# Patient Record
Sex: Female | Born: 1945 | Race: White | Hispanic: No | Smoking: Never smoker
Health system: Southern US, Community
[De-identification: ages and names within clinical notes are randomized; demographics above are authoritative.]

## PROBLEM LIST (undated history)

## (undated) DIAGNOSIS — E039 Hypothyroidism, unspecified: Secondary | ICD-10-CM

## (undated) DIAGNOSIS — R252 Cramp and spasm: Secondary | ICD-10-CM

## (undated) DIAGNOSIS — G5602 Carpal tunnel syndrome, left upper limb: Secondary | ICD-10-CM

## (undated) DIAGNOSIS — R200 Anesthesia of skin: Secondary | ICD-10-CM

## (undated) DIAGNOSIS — K5730 Diverticulosis of large intestine without perforation or abscess without bleeding: Secondary | ICD-10-CM

## (undated) DIAGNOSIS — M1990 Unspecified osteoarthritis, unspecified site: Secondary | ICD-10-CM

## (undated) DIAGNOSIS — E538 Deficiency of other specified B group vitamins: Secondary | ICD-10-CM

## (undated) DIAGNOSIS — R7303 Prediabetes: Secondary | ICD-10-CM

## (undated) DIAGNOSIS — G9332 Myalgic encephalomyelitis/chronic fatigue syndrome: Secondary | ICD-10-CM

## (undated) DIAGNOSIS — K56609 Unspecified intestinal obstruction, unspecified as to partial versus complete obstruction: Secondary | ICD-10-CM

## (undated) DIAGNOSIS — R202 Paresthesia of skin: Secondary | ICD-10-CM

## (undated) DIAGNOSIS — T7840XA Allergy, unspecified, initial encounter: Secondary | ICD-10-CM

## (undated) DIAGNOSIS — R51 Headache: Secondary | ICD-10-CM

## (undated) DIAGNOSIS — E669 Obesity, unspecified: Secondary | ICD-10-CM

## (undated) DIAGNOSIS — H43392 Other vitreous opacities, left eye: Secondary | ICD-10-CM

## (undated) DIAGNOSIS — K219 Gastro-esophageal reflux disease without esophagitis: Secondary | ICD-10-CM

## (undated) DIAGNOSIS — F419 Anxiety disorder, unspecified: Secondary | ICD-10-CM

## (undated) DIAGNOSIS — E785 Hyperlipidemia, unspecified: Secondary | ICD-10-CM

## (undated) DIAGNOSIS — D649 Anemia, unspecified: Secondary | ICD-10-CM

## (undated) DIAGNOSIS — M797 Fibromyalgia: Secondary | ICD-10-CM

## (undated) DIAGNOSIS — R509 Fever, unspecified: Secondary | ICD-10-CM

## (undated) DIAGNOSIS — R42 Dizziness and giddiness: Secondary | ICD-10-CM

## (undated) DIAGNOSIS — D869 Sarcoidosis, unspecified: Secondary | ICD-10-CM

## (undated) DIAGNOSIS — K449 Diaphragmatic hernia without obstruction or gangrene: Secondary | ICD-10-CM

## (undated) DIAGNOSIS — IMO0002 Reserved for concepts with insufficient information to code with codable children: Secondary | ICD-10-CM

## (undated) DIAGNOSIS — E079 Disorder of thyroid, unspecified: Secondary | ICD-10-CM

## (undated) DIAGNOSIS — R5382 Chronic fatigue, unspecified: Secondary | ICD-10-CM

## (undated) DIAGNOSIS — K589 Irritable bowel syndrome without diarrhea: Secondary | ICD-10-CM

## (undated) DIAGNOSIS — M549 Dorsalgia, unspecified: Secondary | ICD-10-CM

## (undated) DIAGNOSIS — R351 Nocturia: Secondary | ICD-10-CM

## (undated) DIAGNOSIS — B079 Viral wart, unspecified: Secondary | ICD-10-CM

## (undated) DIAGNOSIS — N390 Urinary tract infection, site not specified: Secondary | ICD-10-CM

## (undated) DIAGNOSIS — K660 Peritoneal adhesions (postprocedural) (postinfection): Secondary | ICD-10-CM

## (undated) DIAGNOSIS — J45909 Unspecified asthma, uncomplicated: Secondary | ICD-10-CM

## (undated) DIAGNOSIS — Z8719 Personal history of other diseases of the digestive system: Secondary | ICD-10-CM

## (undated) DIAGNOSIS — K649 Unspecified hemorrhoids: Secondary | ICD-10-CM

## (undated) DIAGNOSIS — M659 Synovitis and tenosynovitis, unspecified: Secondary | ICD-10-CM

## (undated) DIAGNOSIS — K279 Peptic ulcer, site unspecified, unspecified as acute or chronic, without hemorrhage or perforation: Secondary | ICD-10-CM

## (undated) DIAGNOSIS — R1084 Generalized abdominal pain: Secondary | ICD-10-CM

## (undated) DIAGNOSIS — M6580 Other synovitis and tenosynovitis, unspecified site: Secondary | ICD-10-CM

## (undated) DIAGNOSIS — R0602 Shortness of breath: Secondary | ICD-10-CM

## (undated) DIAGNOSIS — I951 Orthostatic hypotension: Secondary | ICD-10-CM

## (undated) HISTORY — DX: Headache: R51

## (undated) HISTORY — PX: ESOPHAGOGASTRODUODENOSCOPY: SHX1529

## (undated) HISTORY — PX: CARPAL TUNNEL RELEASE: SHX101

## (undated) HISTORY — PX: LUNG BIOPSY: SHX232

## (undated) HISTORY — DX: Peritoneal adhesions (postprocedural) (postinfection): K66.0

## (undated) HISTORY — DX: Sarcoidosis, unspecified: D86.9

## (undated) HISTORY — DX: Generalized abdominal pain: R10.84

## (undated) HISTORY — DX: Diverticulosis of large intestine without perforation or abscess without bleeding: K57.30

## (undated) HISTORY — DX: Dorsalgia, unspecified: M54.9

## (undated) HISTORY — PX: TONSILLECTOMY: SUR1361

## (undated) HISTORY — DX: Chronic fatigue, unspecified: R53.82

## (undated) HISTORY — DX: Viral wart, unspecified: B07.9

## (undated) HISTORY — DX: Cramp and spasm: R25.2

## (undated) HISTORY — PX: BELPHAROPTOSIS REPAIR: SHX369

## (undated) HISTORY — DX: Peptic ulcer, site unspecified, unspecified as acute or chronic, without hemorrhage or perforation: K27.9

## (undated) HISTORY — DX: Hyperlipidemia, unspecified: E78.5

## (undated) HISTORY — DX: Deficiency of other specified B group vitamins: E53.8

## (undated) HISTORY — DX: Allergy, unspecified, initial encounter: T78.40XA

## (undated) HISTORY — DX: Unspecified osteoarthritis, unspecified site: M19.90

## (undated) HISTORY — DX: Disorder of thyroid, unspecified: E07.9

## (undated) HISTORY — DX: Unspecified intestinal obstruction, unspecified as to partial versus complete obstruction: K56.609

## (undated) HISTORY — PX: DILATION AND CURETTAGE OF UTERUS: SHX78

## (undated) HISTORY — DX: Obesity, unspecified: E66.9

## (undated) HISTORY — DX: Reserved for concepts with insufficient information to code with codable children: IMO0002

## (undated) HISTORY — DX: Orthostatic hypotension: I95.1

## (undated) HISTORY — PX: LYMPH NODE DISSECTION: SHX5087

## (undated) HISTORY — PX: COLONOSCOPY W/ POLYPECTOMY: SHX1380

## (undated) HISTORY — DX: Gastro-esophageal reflux disease without esophagitis: K21.9

## (undated) HISTORY — PX: BIOPSY THYROID: PRO38

## (undated) HISTORY — PX: APPENDECTOMY: SHX54

## (undated) HISTORY — DX: Myalgic encephalomyelitis/chronic fatigue syndrome: G93.32

## (undated) HISTORY — DX: Fever, unspecified: R50.9

---

## 1989-01-05 HISTORY — PX: OTHER SURGICAL HISTORY: SHX169

## 1994-01-05 HISTORY — PX: COLON SURGERY: SHX602

## 1994-01-05 HISTORY — PX: PARTIAL COLECTOMY: SHX5273

## 1996-01-06 HISTORY — PX: LASIK: SHX215

## 1996-01-06 HISTORY — PX: CATARACT EXTRACTION: SUR2

## 1999-09-15 ENCOUNTER — Encounter: Payer: Self-pay | Admitting: Family Medicine

## 1999-09-15 ENCOUNTER — Ambulatory Visit (HOSPITAL_COMMUNITY): Admission: RE | Admit: 1999-09-15 | Discharge: 1999-09-15 | Payer: Self-pay | Admitting: Family Medicine

## 2000-01-06 HISTORY — PX: OTHER SURGICAL HISTORY: SHX169

## 2000-02-17 ENCOUNTER — Ambulatory Visit (HOSPITAL_COMMUNITY): Admission: RE | Admit: 2000-02-17 | Discharge: 2000-02-17 | Payer: Self-pay | Admitting: Family Medicine

## 2000-02-17 ENCOUNTER — Encounter: Payer: Self-pay | Admitting: Family Medicine

## 2001-03-07 ENCOUNTER — Ambulatory Visit (HOSPITAL_COMMUNITY): Admission: RE | Admit: 2001-03-07 | Discharge: 2001-03-07 | Payer: Self-pay | Admitting: Family Medicine

## 2001-03-07 ENCOUNTER — Encounter: Payer: Self-pay | Admitting: Family Medicine

## 2002-02-10 ENCOUNTER — Encounter: Payer: Self-pay | Admitting: Gastroenterology

## 2002-02-10 ENCOUNTER — Ambulatory Visit (HOSPITAL_COMMUNITY): Admission: RE | Admit: 2002-02-10 | Discharge: 2002-02-10 | Payer: Self-pay | Admitting: Gastroenterology

## 2002-06-14 ENCOUNTER — Emergency Department (HOSPITAL_COMMUNITY): Admission: EM | Admit: 2002-06-14 | Discharge: 2002-06-14 | Payer: Self-pay | Admitting: Emergency Medicine

## 2002-06-14 ENCOUNTER — Encounter: Payer: Self-pay | Admitting: Internal Medicine

## 2002-07-04 ENCOUNTER — Encounter: Payer: Self-pay | Admitting: Gastroenterology

## 2002-07-04 ENCOUNTER — Ambulatory Visit (HOSPITAL_COMMUNITY): Admission: RE | Admit: 2002-07-04 | Discharge: 2002-07-04 | Payer: Self-pay | Admitting: Gastroenterology

## 2003-01-06 HISTORY — PX: HERNIA REPAIR: SHX51

## 2003-01-06 LAB — HM MAMMOGRAPHY

## 2003-06-05 ENCOUNTER — Other Ambulatory Visit: Admission: RE | Admit: 2003-06-05 | Discharge: 2003-06-05 | Payer: Self-pay | Admitting: Diagnostic Radiology

## 2003-08-08 ENCOUNTER — Encounter: Admission: RE | Admit: 2003-08-08 | Discharge: 2003-08-08 | Payer: Self-pay | Admitting: General Surgery

## 2003-09-19 ENCOUNTER — Ambulatory Visit (HOSPITAL_COMMUNITY): Admission: RE | Admit: 2003-09-19 | Discharge: 2003-09-19 | Payer: Self-pay | Admitting: Gastroenterology

## 2003-10-09 ENCOUNTER — Inpatient Hospital Stay (HOSPITAL_COMMUNITY): Admission: RE | Admit: 2003-10-09 | Discharge: 2003-10-18 | Payer: Self-pay | Admitting: General Surgery

## 2004-01-06 HISTORY — PX: NOSE SURGERY: SHX723

## 2004-01-06 HISTORY — PX: CHOLECYSTECTOMY: SHX55

## 2004-01-06 HISTORY — PX: NASAL SEPTUM SURGERY: SHX37

## 2004-01-25 ENCOUNTER — Ambulatory Visit: Payer: Self-pay | Admitting: Gastroenterology

## 2005-01-01 ENCOUNTER — Encounter: Payer: Self-pay | Admitting: Family Medicine

## 2005-05-08 ENCOUNTER — Ambulatory Visit (HOSPITAL_COMMUNITY): Admission: RE | Admit: 2005-05-08 | Discharge: 2005-05-08 | Payer: Self-pay | Admitting: Family Medicine

## 2005-08-05 HISTORY — PX: OTHER SURGICAL HISTORY: SHX169

## 2006-10-13 ENCOUNTER — Encounter: Payer: Self-pay | Admitting: Family Medicine

## 2006-10-13 ENCOUNTER — Ambulatory Visit: Payer: Self-pay | Admitting: Infectious Disease

## 2006-10-13 DIAGNOSIS — K660 Peritoneal adhesions (postprocedural) (postinfection): Secondary | ICD-10-CM | POA: Insufficient documentation

## 2006-10-13 DIAGNOSIS — G9009 Other idiopathic peripheral autonomic neuropathy: Secondary | ICD-10-CM | POA: Insufficient documentation

## 2006-10-13 DIAGNOSIS — R5382 Chronic fatigue, unspecified: Secondary | ICD-10-CM | POA: Insufficient documentation

## 2006-10-13 DIAGNOSIS — R51 Headache: Secondary | ICD-10-CM | POA: Insufficient documentation

## 2006-10-13 DIAGNOSIS — K5730 Diverticulosis of large intestine without perforation or abscess without bleeding: Secondary | ICD-10-CM | POA: Insufficient documentation

## 2006-10-13 DIAGNOSIS — K56609 Unspecified intestinal obstruction, unspecified as to partial versus complete obstruction: Secondary | ICD-10-CM | POA: Insufficient documentation

## 2006-10-13 DIAGNOSIS — D869 Sarcoidosis, unspecified: Secondary | ICD-10-CM | POA: Insufficient documentation

## 2006-10-13 DIAGNOSIS — K279 Peptic ulcer, site unspecified, unspecified as acute or chronic, without hemorrhage or perforation: Secondary | ICD-10-CM | POA: Insufficient documentation

## 2006-10-13 DIAGNOSIS — R5081 Fever presenting with conditions classified elsewhere: Secondary | ICD-10-CM | POA: Insufficient documentation

## 2006-10-13 DIAGNOSIS — R519 Headache, unspecified: Secondary | ICD-10-CM | POA: Insufficient documentation

## 2006-10-13 LAB — CONVERTED CEMR LAB
ALT: 21 units/L (ref 0–35)
Albumin: 4.3 g/dL (ref 3.5–5.2)
Alkaline Phosphatase: 92 units/L (ref 39–117)
BUN: 18 mg/dL (ref 6–23)
Basophils Relative: 0 % (ref 0–1)
Calcium: 9.2 mg/dL
Chloride: 107 meq/L (ref 96–112)
Creatinine, Ser: 0.93 mg/dL (ref 0.40–1.20)
Eosinophils Absolute: 0.1 10*3/uL (ref 0.0–0.7)
Glucose, Bld: 97 mg/dL (ref 70–99)
HCT: 43.2 %
HCT: 43.2 % (ref 36.0–46.0)
HCV Ab: NEGATIVE
Hemoglobin: 14.2 g/dL
Lymphs Abs: 1.4 10*3/uL (ref 0.7–3.3)
MCV: 93.3 fL (ref 78.0–100.0)
Neutro Abs: 2.6 10*3/uL (ref 1.7–7.7)
Neutrophils Relative %: 57 % (ref 43–77)
Platelets: 215 10*3/uL (ref 150–400)
Potassium: 3.9 meq/L (ref 3.5–5.3)
Total CK: 78 units/L (ref 7–177)
Uric Acid, Serum: 6.5 mg/dL

## 2006-10-15 ENCOUNTER — Encounter: Payer: Self-pay | Admitting: Infectious Disease

## 2006-12-13 ENCOUNTER — Encounter: Payer: Self-pay | Admitting: Internal Medicine

## 2006-12-13 ENCOUNTER — Ambulatory Visit: Payer: Self-pay | Admitting: Infectious Disease

## 2006-12-13 DIAGNOSIS — R1084 Generalized abdominal pain: Secondary | ICD-10-CM | POA: Insufficient documentation

## 2006-12-13 DIAGNOSIS — B009 Herpesviral infection, unspecified: Secondary | ICD-10-CM | POA: Insufficient documentation

## 2007-01-05 ENCOUNTER — Telehealth: Payer: Self-pay | Admitting: Infectious Disease

## 2007-11-01 ENCOUNTER — Encounter: Payer: Self-pay | Admitting: Infectious Disease

## 2007-11-10 ENCOUNTER — Encounter (INDEPENDENT_AMBULATORY_CARE_PROVIDER_SITE_OTHER): Payer: Self-pay | Admitting: Internal Medicine

## 2008-01-06 HISTORY — PX: DOPPLER ECHOCARDIOGRAPHY: SHX263

## 2008-05-13 HISTORY — PX: OTHER SURGICAL HISTORY: SHX169

## 2008-10-23 ENCOUNTER — Encounter: Payer: Self-pay | Admitting: Family Medicine

## 2009-02-28 ENCOUNTER — Encounter: Payer: Self-pay | Admitting: Family Medicine

## 2009-02-28 LAB — CONVERTED CEMR LAB
BUN: 10 mg/dL
Glucose, Bld: 107 mg/dL
Potassium: 4.1 meq/L
Total Protein: 6.1 g/dL

## 2009-08-15 ENCOUNTER — Ambulatory Visit: Payer: Self-pay | Admitting: Family Medicine

## 2009-08-15 DIAGNOSIS — B079 Viral wart, unspecified: Secondary | ICD-10-CM | POA: Insufficient documentation

## 2009-08-15 DIAGNOSIS — M549 Dorsalgia, unspecified: Secondary | ICD-10-CM | POA: Insufficient documentation

## 2009-08-15 DIAGNOSIS — R252 Cramp and spasm: Secondary | ICD-10-CM | POA: Insufficient documentation

## 2009-08-15 DIAGNOSIS — E039 Hypothyroidism, unspecified: Secondary | ICD-10-CM | POA: Insufficient documentation

## 2009-08-16 ENCOUNTER — Encounter (INDEPENDENT_AMBULATORY_CARE_PROVIDER_SITE_OTHER): Payer: Self-pay | Admitting: *Deleted

## 2009-08-16 LAB — CONVERTED CEMR LAB
BUN: 17 mg/dL (ref 6–23)
CO2: 29 meq/L (ref 19–32)
Calcium: 9.7 mg/dL (ref 8.4–10.5)
Chloride: 106 meq/L (ref 96–112)
Creatinine, Ser: 0.7 mg/dL (ref 0.4–1.2)
GFR calc non Af Amer: 96.45 mL/min (ref >60)
Potassium: 4.2 meq/L (ref 3.5–5.1)
Sodium: 144 meq/L (ref 135–145)

## 2009-09-04 ENCOUNTER — Encounter: Payer: Self-pay | Admitting: Family Medicine

## 2009-09-04 DIAGNOSIS — J309 Allergic rhinitis, unspecified: Secondary | ICD-10-CM | POA: Insufficient documentation

## 2009-09-04 DIAGNOSIS — K219 Gastro-esophageal reflux disease without esophagitis: Secondary | ICD-10-CM | POA: Insufficient documentation

## 2009-09-04 DIAGNOSIS — E669 Obesity, unspecified: Secondary | ICD-10-CM | POA: Insufficient documentation

## 2009-09-04 DIAGNOSIS — E785 Hyperlipidemia, unspecified: Secondary | ICD-10-CM | POA: Insufficient documentation

## 2009-09-04 DIAGNOSIS — M1990 Unspecified osteoarthritis, unspecified site: Secondary | ICD-10-CM | POA: Insufficient documentation

## 2009-09-04 DIAGNOSIS — IMO0002 Reserved for concepts with insufficient information to code with codable children: Secondary | ICD-10-CM | POA: Insufficient documentation

## 2009-09-04 DIAGNOSIS — E538 Deficiency of other specified B group vitamins: Secondary | ICD-10-CM | POA: Insufficient documentation

## 2009-09-13 ENCOUNTER — Encounter: Payer: Self-pay | Admitting: Family Medicine

## 2009-10-11 ENCOUNTER — Telehealth: Payer: Self-pay | Admitting: Family Medicine

## 2009-10-14 ENCOUNTER — Encounter: Payer: Self-pay | Admitting: Family Medicine

## 2009-10-15 ENCOUNTER — Encounter: Payer: Self-pay | Admitting: Family Medicine

## 2009-12-02 ENCOUNTER — Telehealth: Payer: Self-pay | Admitting: Family Medicine

## 2009-12-23 ENCOUNTER — Telehealth: Payer: Self-pay | Admitting: Family Medicine

## 2010-01-26 ENCOUNTER — Encounter: Payer: Self-pay | Admitting: General Surgery

## 2010-01-26 ENCOUNTER — Encounter: Payer: Self-pay | Admitting: Infectious Disease

## 2010-02-04 NOTE — Miscellaneous (Signed)
Summary: TSH level  Clinical Lists Changes  Observations: Added new observation of TSH: 2.090 microintl units/mL (10/14/2009 16:31)

## 2010-02-04 NOTE — Progress Notes (Signed)
Summary: Armour Thyroid  Phone Note Refill Request Message from:  Fax from Pharmacy on December 02, 2009 10:32 AM  Refills Requested: Medication #1:  ARMOUR THYROID 60 MG  TABS Take 1 tablet by mouth once daily [facility]  Phone:   [phone]    The refill request directions say Take 2 tablets by mouth every morning.  Our meds list states 1 tablet by mouth every morning.  Please advise.   Method Requested: Electronic Initial call taken by: Delilah Shan CMA Duncan Dull),  December 02, 2009 10:33 AM  Follow-up for Phone Call        Needs to be 1 by mouth once daily. please call and clarify with them.  thanks  Follow-up by: Crawford Givens MD,  December 02, 2009 11:02 AM  Additional Follow-up for Phone Call Additional follow up Details #1::        Medication phoned to pharmacy.  Additional Follow-up by: Delilah Shan CMA (AAMA),  December 02, 2009 11:22 AM    Prescriptions: ARMOUR THYROID 60 MG  TABS (THYROID) Take 1 tablet by mouth once daily  #90 x 3   Entered and Authorized by:   Crawford Givens MD   Signed by:   Crawford Givens MD on 12/02/2009   Method used:   Telephoned to ...       [facility] (retail)       [address]       [place], [place]  [address]       Ph: [phone]       Fax: [phone]   RxID:   [id number]

## 2010-02-04 NOTE — Letter (Signed)
Summary: Generic Letter   at [facility]  [address] [place], [place] [address]   Phone: [phone]  Fax: [phone]    08/16/2009    Angela Dudley [address] [place], [place]  [address] DOB:  03/26/1945        Please draw at [facility] [phone] on the above-mentioned patient.   Dwana Curd Para March, M.D.  GSD:lsf           Sincerely,   Delilah Shan CMA (AAMA)

## 2010-02-04 NOTE — Op Note (Signed)
Summary: [facility]  [facility]   Imported By: Lanelle Bal 09/16/2009 08:38:46  _____________________________________________________________________  External Attachment:    Type:   Image     Comment:   External Document

## 2010-02-04 NOTE — Letter (Signed)
Summary: [facility] [employer] [facility] [place]   Imported By: Sherian Rein 09/18/2009 09:41:58  _____________________________________________________________________  External Attachment:    Type:   Image     Comment:   External Document  Appended Document: [facility]    Clinical Lists Changes  Observations: Added new observation of PAST MED HX: GERD (ICD-530.81) VITAMIN B12 DEFICIENCY (ICD-266.2) ALLERGIC RHINITIS (ICD-477.9) HYPERLIPIDEMIA (ICD-272.4) OBESITY (ICD-278.00) OSTEOARTHRITIS (ICD-715.90) DEGENERATIVE DISC DISEASE (ICD-722.6) BACK PAIN (ICD-724.5) UNSPECIFIED HYPOTHYROIDISM (ICD-244.9) WART, VIRAL (ICD-078.10) MUSCLE CRAMPS (ICD-729.82) HERPES SIMPLEX WITHOUT MENTION OF COMPLICATION (ICD-054.9) FATIGUE, CHRONIC (ICD-780.79) ABDOMINAL PAIN, GENERALIZED (ICD-789.07) ULCER, PEPTIC NOS W/O HEM/PERF W/O OBST (ICD-533.90) SMALL BOWEL OBSTRUCTION (ICD-560.9) CHRONIC FATIGUE SYNDROME (ICD-780.71) ADHESIONS, 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  Vertebrals demonstrate antegrade flow bilaterally. (05/13/2008) Chest Pain with normal nuclear stress test 08/2005 Diastolic relaxation abnormality, mild AVSC, mild MR/TR and mild to mod AR by echo 2010 Followed by Dr. Ferol Luz with ENT Allergy eval 2011 Dr. Larina Bras (09/18/2009 11:45)         Past History:  Past Medical History: GERD (ICD-530.81) VITAMIN B12 DEFICIENCY (ICD-266.2) ALLERGIC RHINITIS (ICD-477.9) HYPERLIPIDEMIA (ICD-272.4) OBESITY (ICD-278.00) OSTEOARTHRITIS (ICD-715.90) DEGENERATIVE DISC DISEASE (ICD-722.6) BACK PAIN (ICD-724.5) UNSPECIFIED HYPOTHYROIDISM  (ICD-244.9) WART, VIRAL (ICD-078.10) MUSCLE CRAMPS (ICD-729.82) HERPES SIMPLEX WITHOUT MENTION OF COMPLICATION (ICD-054.9) FATIGUE, CHRONIC (ICD-780.79) ABDOMINAL PAIN, GENERALIZED (ICD-789.07) ULCER, PEPTIC NOS W/O HEM/PERF W/O OBST (ICD-533.90) SMALL BOWEL OBSTRUCTION (ICD-560.9) CHRONIC FATIGUE SYNDROME (ICD-780.71) ADHESIONS, 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  Vertebrals demonstrate antegrade flow bilaterally. (05/13/2008) Chest Pain with normal nuclear stress test 08/2005 Diastolic relaxation abnormality, mild AVSC, mild MR/TR and mild to mod AR by echo 2010 Followed by Dr. Ferol Luz with ENT Allergy eval 2011 Dr. Larina Bras
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
## 2010-02-04 NOTE — Miscellaneous (Signed)
Clinical Lists Changes  Problems: Added new problem of DEGENERATIVE DISC DISEASE (ICD-722.6) Added new problem of OSTEOARTHRITIS (ICD-715.90) Added new problem of OBESITY (ICD-278.00) Added new problem of HYPERLIPIDEMIA (ICD-272.4) Added new problem of ALLERGIC RHINITIS (ICD-477.9) Added new problem of VITAMIN B12 DEFICIENCY (ICD-266.2) Added new problem of GERD (ICD-530.81) Observations: Added new observation of PAST SURG HX: R hip bursa removal 1991 Colectomy, partial 1996, complicated by an abscess formation LASIK, cataract 1998 SBO 2002 Hernia repair 2005 and 2006 (ventral) Nasal surgery 2006 Appendectomy Cholecystectomy Tonsillectomy Hysterectomy 1954 Right Hip Bursectomy 1961 Sarcoidosis 1994  (09/04/2009 14:56) Added new observation of PAST MED HX: GERD (ICD-530.81) VITAMIN B12 DEFICIENCY (ICD-266.2) ALLERGIC RHINITIS (ICD-477.9) HYPERLIPIDEMIA (ICD-272.4) OBESITY (ICD-278.00) OSTEOARTHRITIS (ICD-715.90) DEGENERATIVE DISC DISEASE (ICD-722.6) BACK PAIN (ICD-724.5) UNSPECIFIED HYPOTHYROIDISM (ICD-244.9) WART, VIRAL (ICD-078.10) MUSCLE CRAMPS (ICD-729.82) HERPES SIMPLEX WITHOUT MENTION OF COMPLICATION (ICD-054.9) FATIGUE, CHRONIC (ICD-780.79) ABDOMINAL PAIN, GENERALIZED (ICD-789.07) ULCER, PEPTIC NOS W/O HEM/PERF W/O OBST (ICD-533.90) SMALL BOWEL OBSTRUCTION (ICD-560.9) CHRONIC FATIGUE SYNDROME (ICD-780.71) ADHESIONS, 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  Vertebrals demonstrate antegrade flow bilaterally. (05/13/2008) Chest Pain with normal nuclear stress test 08/2005 Diastolic relaxation abnormality, mild AVSC, mild MR/TR and mild to mod AR by echo 2010 (09/04/2009  14:56) Added new observation of HGB: 13.9 g/dL (30/86/5784 69:62) Added new observation of CALCIUM: 9.4 mg/dL (95/28/4132 44:01) Added new observation of PROTEIN, TOT: 6.1 g/dL (02/72/5366 44:03) Added new observation of SGPT (ALT): 40 units/L (02/28/2009 14:56) Added new observation of SGOT (AST): 34 units/L (02/28/2009 14:56) Added new observation of CREATININE: 0.90 mg/dL (47/42/5956 38:75) Added new observation of BUN: 10 mg/dL (64/33/2951 88:41) Added new observation of BG RANDOM: 107 mg/dL (66/06/3014 01:09) Added new observation of K SERUM: 4.1 meq/L (02/28/2009 14:56) Added new observation of NA: 144 meq/L (02/28/2009 14:56) Added new observation of HCT: 43.2 % (10/13/2006 14:56) Added new observation of HGB: 14.2 g/dL (32/35/5732 20:25) Added new observation of URIC ACID: 6.5 mg/dL (42/70/6237 62:83) Added new observation of CALCIUM: 9.2 mg/dL (15/17/6160 73:71) Added new observation of PROTEIN, TOT: 6.6 g/dL (07/01/9483 46:27) Added new observation of SGPT (ALT): 21 units/L (10/13/2006 14:56) Added new observation of SGOT (AST): 21 units/L (10/13/2006 14:56) Added new observation of CREATININE: 0.93 mg/dL (03/50/0938 18:29) Added new observation of BUN: 18 mg/dL (93/71/6967 89:38) Added new observation of BG RANDOM: 97 mg/dL (10/21/5100 58:52) Added new observation of K SERUM: 3.9 meq/L (10/13/2006 14:56) Added new observation of NA: 146 meq/L (10/13/2006 14:56) Added new observation of COLONOSCOPY: Done  (08/05/2004 15:11) Added new observation of MAMMOGRAM: Done  (01/06/2003 15:11) Added new observation of TD BOOSTER: Td  (01/05/2001 15:11) Added new observation of BONE DENSITY: Done std dev (01/05/1997 15:11) Added new observation of PNEUMOVAX: Pneumovax  (01/06/1996 15:12)      Past History:  Past Medical History: GERD (ICD-530.81) VITAMIN B12 DEFICIENCY (ICD-266.2) ALLERGIC RHINITIS (ICD-477.9) HYPERLIPIDEMIA (ICD-272.4) OBESITY (ICD-278.00) OSTEOARTHRITIS  (ICD-715.90) DEGENERATIVE DISC DISEASE (ICD-722.6) BACK PAIN (ICD-724.5) UNSPECIFIED HYPOTHYROIDISM (ICD-244.9) WART, VIRAL (ICD-078.10) MUSCLE CRAMPS (ICD-729.82) HERPES SIMPLEX WITHOUT MENTION OF COMPLICATION (ICD-054.9) FATIGUE, CHRONIC (ICD-780.79) ABDOMINAL PAIN, GENERALIZED (ICD-789.07) ULCER, PEPTIC NOS W/O HEM/PERF W/O OBST (ICD-533.90) SMALL BOWEL OBSTRUCTION (ICD-560.9) CHRONIC FATIGUE SYNDROME (ICD-780.71) ADHESIONS, 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  Vertebrals demonstrate antegrade flow bilaterally. (05/13/2008) Chest Pain with normal nuclear stress test 08/2005 Diastolic relaxation abnormality, mild AVSC, mild MR/TR and mild to mod AR by echo 2010  Past Surgical History: R hip bursa removal 1991 Colectomy, partial 1996, complicated by an abscess formation LASIK, cataract 1998 SBO 2002 Hernia repair 2005 and 2006 (ventral) Nasal surgery 2006 Appendectomy Cholecystectomy Tonsillectomy Hysterectomy 1954 Right Hip Bursectomy 1961 Sarcoidosis 1994     Preventive Care Screening  Colonoscopy:    Date:  08/05/2004    Results:  Done  Mammogram:    Date:  01/06/2003    Results:  Done  Last Tetanus Booster:    Date:  01/05/2001    Results:  Td  Bone Density:    Date:  01/05/1997    Results:  Done     Cardiolite 1999 Declines further mammography.    Immunization History:  Pneumovax Immunization History:    Pneumovax:  pneumovax (01/06/1996)

## 2010-02-04 NOTE — Progress Notes (Signed)
Summary: gabapentin  Phone Note Refill Request Message from:  Fax from Pharmacy on October 11, 2009 10:19 AM  Refills Requested: Medication #1:  GABAPENTIN 100 MG CAPS Take 3 capsules by mouth at bedtime   Last Refilled: 08/10/2009 Refill request from [facility]. [phone].   Initial call taken by: Melody Comas,  October 11, 2009 10:19 AM  Follow-up for Phone Call       Follow-up by: Crawford Givens MD,  October 13, 2009 6:46 PM    Prescriptions: GABAPENTIN 100 MG CAPS (GABAPENTIN) Take 3 capsules by mouth at bedtime  #270 x 3   Entered and Authorized by:   Crawford Givens MD   Signed by:   Crawford Givens MD on 10/13/2009   Method used:   Faxed to ...       [facility] (retail)       [address]       [place], [place]  [address]       Ph: [phone]       Fax: [phone]   RxID:   [id number]

## 2010-02-06 NOTE — Progress Notes (Signed)
Summary: carisoprodol  Phone Note Refill Request Message from:  Fax from Pharmacy on December 23, 2009 9:33 AM  Refills Requested: Medication #1:  CARISOPRODOL 350 MG TABS Take 1 tablet by mouth once a day   Last Refilled: 10/11/2009 Refill request from [phone].  Initial call taken by: Melody Comas,  December 23, 2009 9:33 AM  Follow-up for Phone Call        please call in.  thanks.  Follow-up by: Crawford Givens MD,  December 23, 2009 1:43 PM  Additional Follow-up for Phone Call Additional follow up Details #1::        Rx called to pharmacy Additional Follow-up by: Sydell Axon LPN,  December 23, 2009 4:58 PM    Prescriptions: CARISOPRODOL 350 MG TABS (CARISOPRODOL) Take 1 tablet by mouth once a day  #90 x 1   Entered and Authorized by:   Crawford Givens MD   Signed by:   Crawford Givens MD on 12/23/2009   Method used:   Telephoned to ...       [facility] (retail)       [address]       [place], [place]  [address]       Ph: [phone]       Fax: [phone]   RxID:   [id number]

## 2010-05-23 NOTE — Op Note (Signed)
NAMEKASHAE, CARSTENS                ACCOUNT NO.:  [id number]   MEDICAL RECORD NO.:  [id number]          PATIENT TYPE:  INP   LOCATION:  0362                         FACILITY:  [facility]   PHYSICIAN:  Sharlet Salina T. Hoxworth, M.D.DATE OF BIRTH:  [birth year]   DATE OF PROCEDURE:  10/09/2003  DATE OF DISCHARGE:                                 OPERATIVE REPORT   PREOPERATIVE DIAGNOSIS:  Ventral incisional hernia.   POSTOPERATIVE DIAGNOSIS:  Ventral incisional hernia.   SURGICAL PROCEDURE:  Laparoscopic-assisted repair of ventral incisional  hernia.   SURGEON:  Dr. Johna Sheriff   ANESTHESIA:  General.   BRIEF HISTORY:  Anacaren Kohan is a 65 year old female with a history of  colectomy for diverticulitis and also primary repair of a ventral hernia  with extensive adhesions requiring small bowel resection several years ago.  Both procedures done in [facility].  She now presents with an increasingly  symptomatic large midline ventral hernia with episodic small bowel  obstruction.  She has had an extensive work-up with no other significant  abdominal pathology found.  She is morbidly obese as well.  Due to  increasing symptoms and episodic bowel obstruction, repair is recommended.  I have recommended laparoscopic repair with possible open repair.  Risks of  bleeding, infection, bowel injury, recurrence, have been discussed and  understood.  She is now brought to the operating room for this procedure.   DESCRIPTION OF OPERATION:  The patient brought to the operating room and  placed in supine position on the operating table, and general endotracheal  anesthesia was induced.  She was given vancomycin IV preoperatively.  Foley  catheter was placed.  Heparin was administered 5000 units subcutaneously.  The abdomen was widely sterilely prepped and draped.  There was a large  hernia defect palpable in the mid abdomen around the umbilicus at her  midline incision.  Abdominal access was obtained with an 11  mm OptiView  trocar in the left subcostal area without difficulty, and pneumoperitoneum  established.  There were noted to be extensive adhesions of omentum and  bowel to the midline and up into several large adjacent hernia defects.  Two  further 5 mm trocars were placed for traction and use of the harmonic  scalpel in the left mid abdomen and epigastric area.  Extensive adhesiolysis  was then performed, taking down a large amount of omentum from the anterior  abdominal wall.  There were essentially two large adjacent hernia defects of  ridge between them and then also a few swiss cheese type defects extending  more superiorly along the abdominal wall.  A great deal of the adhesions  were able to be taken down laparoscopically, completely clearing one large  hernia defect.  There were some small bowel adhesions, but these were filmy  and were taken down under direct vision with sharp dissection.  A lot of  omental adhesions were taken down with the harmonic scalpel.  A loop of  transverse colon was also up into this same defect which was carefully taken  down under direct vision without difficulty.  However, a second large  defect  was approached which was a little more toward the right side of the midline.  There was tight, dense adhesions of small bowel up into the hernia sac which  could not be retracted and exposed without risk of damage to the small  intestine.  At this point, I elected to open as much of the incision as  necessary to get these reduced.  Pneumoperitoneum was released and then the  previous midline incision, mid portion, was used and dissection carried down  through subcutaneous tissue.  The large hernia sac was encountered.  This  was opened.  There were rather dense small bowel adhesions up into the  subcutaneous tissue with several loops of small bowel really adherent near  the skin level, and these were completely lysed and taken down and reduced  into the abdominal  cavity.  Again noted were two adjacent hernia defects,  each defect measuring about 5-6 cm in diameter with a single band of the  fascia left between them.  A few smaller swiss cheese defects extending more  superiorly.  The defect was converted into one defect, dividing the band.  I  elected to close the hernia primarily anteriorly and then go back in  laparoscopically to place a reinforcing piece of mesh intra-abdominally.  The fascial edges were defined in all directions and large hernia sacs  excised from the subcutaneous tissue and skin and subcutaneous flaps raised  off of the fascia to allow primary closure and to excise the hernia sacs.  Midline fascia was then closed with running #1 PDS begun at either end of  the incision with interrupting 0 Prolene sutures as well.  This was closed  with mild tension.  After primary closure of the fascia, pneumoperitoneum  was reestablished.  All adhesions were seen to be away from the abdominal  wall with intact fascial closure.  The falciform ligament was taken down  with harmonic scalpel to allow overlap of the mesh superiorly.  A 25 x 20 cm  piece of Parietex dual mesh was used to allow wide coverage of the closure  and the small swiss cheese defects more securely in all directions.  Six  stay sutures of 0 Prolene were placed around the periphery of the mesh which  was then moistened, rolled, and introduced in the abdominal cavity and  unfurled.  Through six previously planned small stab incisions corresponding  to the sutures, the sutures were brought up through the abdominal wall.  These were then tied and secured with good deployment of the mesh in all  direction around the primary repair and the small defect superiorly.  The  Endotak was then used to secure the mesh at its periphery circumferentially  under direct vision.  This appeared to provide nice broad coverage of the anterior abdominal wall.  The abdomen was again inspected for  hemostasis  which appeared intact, and there was no evidence of bowel injury anywhere.  CO2 was evacuated and trocars removed.  A closed suction drain was left in  the large subcutaneous pocket of the hernia site, and the wound was  irrigated and the skin closed with staples.  The trocar sites were closed  with staples.  Sponge, needle, and instrument counts were correct.  Dry  sterile dressings were applied, and the patient was taken to recovery in  good condition.      BTH/MEDQ  D:  10/09/2003  T:  10/09/2003  Job:  [id number]

## 2010-05-23 NOTE — Discharge Summary (Signed)
[name], [name]                ACCOUNT NO.:  [id number]   MEDICAL RECORD NO.:  [id number]          PATIENT TYPE:  INP   LOCATION:  0362                         FACILITY:  [facility]   PHYSICIAN:  Sharlet Salina T. Hoxworth, M.D.DATE OF BIRTH:  1945/06/29   DATE OF ADMISSION:  10/09/2003  DATE OF DISCHARGE:  10/18/2003                                 DISCHARGE SUMMARY   DISCHARGE DIAGNOSES:  Ventral incisional hernia.   OPERATION/PROCEDURES:  Laparoscopic assisted repair of ventral incisional  hernia, October 09, 2003.   HISTORY OF PRESENT ILLNESS:  [name] is a 65 year old white female with  a long history of chronic abdominal pain and GI complaints.  However in  recent months she has had gradually worsening mid abdominal pain associated  with nausea and occasional vomiting.  Her pain is located around the site of  the previous midline incision where there is a large partially reducible  incisional hernia.  Her pain is progressive and she has had some symptoms  suggesting partial bowel obstruction.  After extensive work up to exclude  other causes of her pain, with no other findings except for a large ventral  hernia, the patient was admitted for repair.   PAST MEDICAL HISTORY:  She is followed by Dr. Abigail Miyamoto and Dr. Jarold Motto for  GI problems.  1.  She has a history of peripheral neuropathy.  2.  Chronic fatigue syndrome.  3.  Fibromyalgia.  4.  Hyperlipidemia.  5.  Sarcoidosis.  6.  Hyperthyroidism.  7.  Osteoarthritis.  8.  Allergic rhinitis.   PAST SURGICAL HISTORY:  Colectomy for diverticulitis in 1996 and subsequent  laparotomy for small bowel obstruction with small bowel resection in 2002,  both done in [facility] through a midline incision.   CURRENT MEDICATIONS:  1.  Neurontin 100 mg b.i.d.  2.  Zyrtec 10 mg daily.  3.  Over-the-counter Pepcid.  4.  Desiccated thyroid 65 mg b.i.d.  5.  Darvocet p.r.n.   SOCIAL HISTORY, FAMILY HISTORY AND REVIEW OF SYSTEMS:  Please  see dictated  H&P.   PHYSICAL EXAMINATION:  Morbidly obese at 230 pounds.  Blood pressure 154/80,  pulse 95, afebrile.  Pertinet findings are limited to the abdomen.  Very obese. There is a  midline incision with a large somewhat tender, partially reducible ventral  hernia presenting mostly below the midline.   HOSPITAL COURSE:  The patient was admitted on the morning of her procedure.  She underwent repair of her large hernia, laparoscopically assisted.  A  small open incision was necessary to reduce some of the dense small bowel  adhesions from the hernia sac.  A large piece of Parietex  dual mesh was  placed intraperitoneally with broad coverage of her hernia defect.  Postoperatively her recovery was relatively uncomplicated although fairly  slow.  She initially had some fever up as high as 101 that was felt  secondary to atelectasis and did respond to pulmonary toilet.  She developed  some urinary retention requiring Foley catheter reinsertion.  At that time  urinalysis did show evidence of a cystitis and subsequent urine  culture was  positive for Klebsiella and she was treated with p.o. Cipro.  Her urinary  symptoms resolved and the catheter was eventually able to be removed after  about 5 to 6 days.  It was difficult for her to get up and around and  mobilization was difficult.  This was assisted with physical therapy.  Diet  tolerance was slow due to some ileus and bloating but this gradually  resolved and she was able to be advanced to a regular diet which she was  tolerating well by the fifth or sixth day postoperatively.  At this point  there were continued problems with mobility and her feeling that she could  not care for herself at home.  She worked with physical therapy daily and  made slow but steady progress and was felt ready for discharge on October 18, 2003.  At this point her would is healing nicely without any evidence of  infection.  Her repair feels solid.  She  is tolerating a regular diet.  She  has 5 more days of Cipro to finish up her treatment for urinary tract  infection and the remainder of her medications are unchanged.   FOLLOW UP:  My office in approximately 2 weeks.  Staples are removed.      BTH/MEDQ  D:  10/22/2003  T:  10/23/2003  Job:  [id number]   cc:   Chales Salmon. Abigail Miyamoto, M.D.  [address]  [place]  [place] [address]  Fax: [phone]   Vania Rea. Jarold Motto, M.D. [facility]

## 2010-06-20 ENCOUNTER — Other Ambulatory Visit: Payer: Self-pay | Admitting: *Deleted

## 2010-06-20 MED ORDER — CARISOPRODOL 350 MG PO TABS: ORAL_TABLET | ORAL | Status: DC

## 2010-06-20 NOTE — Telephone Encounter (Signed)
Please call in

## 2010-06-23 NOTE — Telephone Encounter (Signed)
Rx called to pharmacy

## 2010-07-14 ENCOUNTER — Other Ambulatory Visit: Payer: Self-pay | Admitting: *Deleted

## 2010-07-14 MED ORDER — TRAMADOL HCL 50 MG PO TABS: 50.0000 mg | ORAL_TABLET | Freq: Four times a day (QID) | ORAL | Status: DC | PRN

## 2010-07-14 NOTE — Telephone Encounter (Signed)
Received faxed refill request from pharmacy for Tramadol 50mg , take one tablet by mouth as needed for pain every 6 hours, last refill #60 on 12/21/09

## 2010-10-13 ENCOUNTER — Other Ambulatory Visit: Payer: Self-pay | Admitting: *Deleted

## 2010-10-14 MED ORDER — GABAPENTIN 100 MG PO CAPS: 300.0000 mg | ORAL_CAPSULE | Freq: Every day | ORAL | Status: DC

## 2010-10-14 NOTE — Telephone Encounter (Signed)
Please schedule physical for this fall. Thanks.

## 2010-10-15 NOTE — Telephone Encounter (Signed)
CPE scheduled 12/12/2010.  Patient says she would like for you to mail her an order for labs prior so that she can get them done at a lab closer to her home.

## 2010-10-15 NOTE — Telephone Encounter (Signed)
rx written with orders, please mail to pt.  Thanks .

## 2010-10-16 ENCOUNTER — Encounter: Payer: Self-pay | Admitting: *Deleted

## 2010-10-16 NOTE — Telephone Encounter (Signed)
Mailed to patient

## 2010-11-10 ENCOUNTER — Other Ambulatory Visit: Payer: Self-pay | Admitting: *Deleted

## 2010-11-10 MED ORDER — GABAPENTIN 100 MG PO CAPS: 300.0000 mg | ORAL_CAPSULE | Freq: Every day | ORAL | Status: DC

## 2010-11-10 NOTE — Telephone Encounter (Signed)
Needs physical scheduled, if not already on the schedule.  Thanks.

## 2010-11-10 NOTE — Telephone Encounter (Signed)
Patient requests 90 day supply. Ok to do ?

## 2010-11-23 ENCOUNTER — Encounter: Payer: Self-pay | Admitting: Family Medicine

## 2010-12-03 ENCOUNTER — Encounter: Payer: Self-pay | Admitting: Family Medicine

## 2010-12-04 ENCOUNTER — Encounter: Payer: Self-pay | Admitting: Family Medicine

## 2010-12-12 ENCOUNTER — Ambulatory Visit (INDEPENDENT_AMBULATORY_CARE_PROVIDER_SITE_OTHER): Payer: Medicare Other | Admitting: Family Medicine

## 2010-12-12 ENCOUNTER — Encounter: Payer: Self-pay | Admitting: Family Medicine

## 2010-12-12 VITALS — BP 112/66 | HR 78 | Temp 98.1°F | Wt 215.5 lb

## 2010-12-12 DIAGNOSIS — E039 Hypothyroidism, unspecified: Secondary | ICD-10-CM

## 2010-12-12 DIAGNOSIS — B079 Viral wart, unspecified: Secondary | ICD-10-CM

## 2010-12-12 DIAGNOSIS — G9332 Myalgic encephalomyelitis/chronic fatigue syndrome: Secondary | ICD-10-CM

## 2010-12-12 DIAGNOSIS — M549 Dorsalgia, unspecified: Secondary | ICD-10-CM

## 2010-12-12 DIAGNOSIS — E785 Hyperlipidemia, unspecified: Secondary | ICD-10-CM

## 2010-12-12 DIAGNOSIS — R252 Cramp and spasm: Secondary | ICD-10-CM

## 2010-12-12 DIAGNOSIS — R5382 Chronic fatigue, unspecified: Secondary | ICD-10-CM

## 2010-12-12 DIAGNOSIS — Z23 Encounter for immunization: Secondary | ICD-10-CM

## 2010-12-12 MED ORDER — THYROID 60 MG PO TABS: 60.0000 mg | ORAL_TABLET | Freq: Every day | ORAL | Status: DC

## 2010-12-12 MED ORDER — TRAMADOL HCL 50 MG PO TABS: 50.0000 mg | ORAL_TABLET | Freq: Four times a day (QID) | ORAL | Status: AC | PRN

## 2010-12-12 MED ORDER — GABAPENTIN 100 MG PO CAPS: 300.0000 mg | ORAL_CAPSULE | Freq: Every day | ORAL | Status: DC

## 2010-12-12 NOTE — Patient Instructions (Signed)
Take care.  Don't change your meds and I'll see you in a year, sooner if needed.

## 2010-12-15 ENCOUNTER — Encounter: Payer: Self-pay | Admitting: Family Medicine

## 2010-12-15 NOTE — Assessment & Plan Note (Signed)
Continue work on diet and exercise.  I wouldn't add statin given the prev cramping.

## 2010-12-15 NOTE — Assessment & Plan Note (Signed)
Will follow.

## 2010-12-15 NOTE — Assessment & Plan Note (Signed)
Cont current meds.

## 2010-12-15 NOTE — Progress Notes (Signed)
Hypothyroid- TSH wnl on lower dose of meds.  Fatigue continues as below, but no other sig change in sx.  We talked about this and I wouldn't change her dose given the prev low TSH that normalized on lower replacement.  Pap/mammogram declined.  Colonoscopy prev done per Dr. Katrinka Blazing in [place].   Neck/back/arm pain.  Has had neuro eval and is improved with acupuncture.   Muscle cramps.  Continues to be an issue, likely related to hydration and salt intake.  Worse with exertion in the heat.    Chronic fatigue.  "I'm tired but there isn't anything to do but treat the symptoms."  Labs d/w pt.    Wart on R arm.  Would like treated.  Verbal informed consent for liq N2 obtained.  D/w pt about routine treatment, possible need for retreatment, risk/possible complications.  She elected to proceed.   Meds, vitals, and allergies reviewed.   ROS: See HPI.  Otherwise, noncontributory.  GEN: nad, alert and oriented, overweight HEENT: mucous membranes moist NECK: supple w/o LA, thyroid not ttp CV: rrr PULM: ctab, no inc wob ABD: soft, +bs EXT: no edema SKIN: no acute rash

## 2010-12-15 NOTE — Assessment & Plan Note (Signed)
Small (<1cm) wart on R arm.  Treated with 3 freeze thaw cycles and tolerated well, no complications.  Routine instructions given.

## 2010-12-15 NOTE — Assessment & Plan Note (Signed)
And arm pain improved with acupuncture

## 2010-12-15 NOTE — Assessment & Plan Note (Signed)
Continue hydration and follow.

## 2011-01-16 ENCOUNTER — Encounter: Payer: Self-pay | Admitting: Family Medicine

## 2011-01-16 ENCOUNTER — Ambulatory Visit (INDEPENDENT_AMBULATORY_CARE_PROVIDER_SITE_OTHER): Payer: Medicare Other | Admitting: Family Medicine

## 2011-01-16 DIAGNOSIS — M549 Dorsalgia, unspecified: Secondary | ICD-10-CM

## 2011-01-16 DIAGNOSIS — J069 Acute upper respiratory infection, unspecified: Secondary | ICD-10-CM

## 2011-01-16 NOTE — Progress Notes (Signed)
Burning in nose since yesterday AM and watery eyes since yesterday.  She took some zinc by mouth and she had GI upset after that (she didn't take it with  food).  Mild ST.  No fevers.   She's still on her nasal sprays, at her baseline use.  Today the burning and watery eyes are better.    Back pain, above the waist on L side for about 2-3 weeks, a 'nagging pain'.  Intermittent.  No recent sig abd pain.  She had some improvement in neck and shoulder pain with acupuncture prev.  No fevers.  No dysuria.  She exercising and stretching at night.  Was pulling weeds before pain started.   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    Handicap form for parking sticker filled out due to h/o neuropathy.    Meds, vitals, and allergies reviewed.   ROS: See HPI.  Otherwise, noncontributory.  Nad, overweight ncat Mmm Tm wnl, minimal nasal irritation, minimal OP cobblestoning, sinuses not ttp rrr ctab No midline back pain L SI joint not ttp but that's the area and muscles tighter superiorly.

## 2011-01-16 NOTE — Patient Instructions (Signed)
I would take the zinc and the regular nose sprays.  Stretch your back.  That should improve. Take care.  Glad to see you.

## 2011-01-18 DIAGNOSIS — J069 Acute upper respiratory infection, unspecified: Secondary | ICD-10-CM | POA: Insufficient documentation

## 2011-01-18 NOTE — Assessment & Plan Note (Signed)
Sinuses not ttp, looks too well to have flu.  Likely viral.  Using Zinc and nasal sprays.  Okay for f/u prn

## 2011-01-18 NOTE — Assessment & Plan Note (Signed)
Likely muscle strain, aggravated by yard work, no weakness on exam for BLE and gait at baseline.  Gradual return to activity and stretch daily.

## 2011-01-30 ENCOUNTER — Telehealth: Payer: Self-pay | Admitting: Family Medicine

## 2011-01-30 MED ORDER — GABAPENTIN 100 MG PO CAPS: 300.0000 mg | ORAL_CAPSULE | Freq: Every day | ORAL | Status: DC

## 2011-01-30 NOTE — Telephone Encounter (Signed)
I called the patient and it was resent to the pharmacy that she requested.

## 2011-01-30 NOTE — Telephone Encounter (Signed)
Pt frustrated because the pharm did not get one of the meds refilled and she needs call back/resolution today.  She said she uses [facility] in [place] on [address]

## 2011-10-29 ENCOUNTER — Ambulatory Visit (INDEPENDENT_AMBULATORY_CARE_PROVIDER_SITE_OTHER): Payer: Medicare Other | Admitting: Family Medicine

## 2011-10-29 ENCOUNTER — Encounter: Payer: Self-pay | Admitting: Family Medicine

## 2011-10-29 VITALS — BP 114/68 | HR 83 | Temp 98.1°F | Wt 224.8 lb

## 2011-10-29 DIAGNOSIS — G9332 Myalgic encephalomyelitis/chronic fatigue syndrome: Secondary | ICD-10-CM

## 2011-10-29 DIAGNOSIS — E039 Hypothyroidism, unspecified: Secondary | ICD-10-CM

## 2011-10-29 DIAGNOSIS — K219 Gastro-esophageal reflux disease without esophagitis: Secondary | ICD-10-CM

## 2011-10-29 DIAGNOSIS — R5382 Chronic fatigue, unspecified: Secondary | ICD-10-CM

## 2011-10-29 DIAGNOSIS — T148 Other injury of unspecified body region: Secondary | ICD-10-CM

## 2011-10-29 DIAGNOSIS — W57XXXA Bitten or stung by nonvenomous insect and other nonvenomous arthropods, initial encounter: Secondary | ICD-10-CM

## 2011-10-29 DIAGNOSIS — E7800 Pure hypercholesterolemia, unspecified: Secondary | ICD-10-CM

## 2011-10-29 DIAGNOSIS — R5381 Other malaise: Secondary | ICD-10-CM

## 2011-10-29 DIAGNOSIS — R5383 Other fatigue: Secondary | ICD-10-CM

## 2011-10-29 DIAGNOSIS — E538 Deficiency of other specified B group vitamins: Secondary | ICD-10-CM

## 2011-10-29 LAB — CBC WITH DIFFERENTIAL/PLATELET
Basophils Absolute: 0 10*3/uL (ref 0.0–0.1)
Basophils Relative: 0.4 % (ref 0.0–3.0)
Eosinophils Absolute: 0.2 10*3/uL (ref 0.0–0.7)
Eosinophils Relative: 3.5 % (ref 0.0–5.0)
Monocytes Absolute: 0.5 10*3/uL (ref 0.1–1.0)
Neutro Abs: 2.9 10*3/uL (ref 1.4–7.7)
RBC: 4.87 Mil/uL (ref 3.87–5.11)
RDW: 13.6 % (ref 11.5–14.6)

## 2011-10-29 NOTE — Assessment & Plan Note (Signed)
Check B12 level. 

## 2011-10-29 NOTE — Patient Instructions (Addendum)
Get over the counter biotin and take 1 a day for your nails.  Go to the lab on the way out.  We'll contact you with your lab report. Take care.  If the cough doesn't improve and the ENT work up doesn't help, then notify us.

## 2011-10-29 NOTE — Assessment & Plan Note (Signed)
Check TSH and add on biotin for nail changes.

## 2011-10-29 NOTE — Progress Notes (Signed)
Requesting records from hospitalization in [place] in 2013.    "My thermostat is off."  Hot and cold periods with inability to lose weight.  Constipation, treated with yogurt. She has had nail changes, more brittle.  On armour thyroid. Due for TSH. Fatigue continues.  She was asking about potential tick exposures and whether this could be related.  No recent tick bites.    H/o RLS that improved with K supplementation. Less limb movement and sleep disruption at night with K supplement.  Due for labs.    occ cough, after meals.  H/o sarcoid, has ENT f/u pending for reflux eval.    PMH and SH reviewed  ROS: See HPI, otherwise noncontributory.  Meds, vitals, and allergies reviewed.   nad Obese, central Tm wnl Nasal and OP exam w/o erythema Neck supple, no LA Thyroid not ttp rrr ctab abd soft, obese Ext w/o edema

## 2011-10-29 NOTE — Assessment & Plan Note (Signed)
eval pending.  We can eval the cough further if this is unremarkable, ie sarcoid.

## 2011-10-29 NOTE — Assessment & Plan Note (Signed)
Check routine labs today for fatigue and will notify pt. I doubt active tick related pathology and advised pt as such.

## 2011-10-30 ENCOUNTER — Other Ambulatory Visit: Payer: Self-pay | Admitting: Family Medicine

## 2011-10-30 ENCOUNTER — Encounter: Payer: Self-pay | Admitting: *Deleted

## 2011-10-30 LAB — LIPID PANEL
Total CHOL/HDL Ratio: 7
Triglycerides: 202 mg/dL — ABNORMAL HIGH (ref 0.0–149.0)

## 2011-10-30 LAB — COMPREHENSIVE METABOLIC PANEL
AST: 26 U/L (ref 0–37)
Albumin: 3.7 g/dL (ref 3.5–5.2)
Alkaline Phosphatase: 64 U/L (ref 39–117)
BUN: 15 mg/dL (ref 6–23)
CO2: 25 mEq/L (ref 19–32)
Calcium: 9.4 mg/dL (ref 8.4–10.5)
Creatinine, Ser: 0.7 mg/dL (ref 0.4–1.2)
Glucose, Bld: 87 mg/dL (ref 70–99)
Total Protein: 7 g/dL (ref 6.0–8.3)

## 2011-10-30 LAB — VITAMIN B12: Vitamin B-12: 1029 pg/mL — ABNORMAL HIGH (ref 211–911)

## 2011-10-30 LAB — LDL CHOLESTEROL, DIRECT: Direct LDL: 153.5 mg/dL

## 2011-10-30 LAB — TSH: TSH: 1.15 u[IU]/mL (ref 0.35–5.50)

## 2011-11-06 ENCOUNTER — Telehealth: Payer: Self-pay | Admitting: *Deleted

## 2011-11-06 DIAGNOSIS — E878 Other disorders of electrolyte and fluid balance, not elsewhere classified: Secondary | ICD-10-CM

## 2011-11-06 NOTE — Telephone Encounter (Signed)
Patient was advised about CXR report WNL and awaiting ENT report.  Patient says that she doesn't think you realize how much Potassium she is taking.  She takes a 595 mg tablet and takes 10 or 11 tablets per day.  I asked who prescribed that and she said she started taking it herself because she had such severe muscle cramps all over her body, even in her abdomen and once she gets enough of this in her system, it alleviates the cramps.

## 2011-11-07 NOTE — Telephone Encounter (Signed)
Her K is wnl.  I would cut down by 2 tabs per day until she is off and recheck K in 1 week.  Orders are in.

## 2011-11-09 MED ORDER — POTASSIUM GLUCONATE 595 (99 K) MG PO TABS: ORAL_TABLET | ORAL | Status: DC

## 2011-11-09 MED ORDER — POTASSIUM CHLORIDE CRYS ER 20 MEQ PO TBCR: 20.0000 meq | EXTENDED_RELEASE_TABLET | Freq: Every day | ORAL | Status: DC

## 2011-11-09 NOTE — Telephone Encounter (Signed)
Noted.  This translates into about 25 mEq of K a day.  If she doesn't think she can stop it due to cramps, then it is okay to continue.

## 2011-11-09 NOTE — Telephone Encounter (Signed)
Patient notified as instructed by telephone. Was advised by patient that she can not stop taking the potassium because of her severe muscle cramps. Patient states that when she was in the hospital her potassium was low and she is concerned that if she does not take the potassium it will get low again.  Patient wanted to let you know that she took all of her vitamins prior to having her lab work done that day.

## 2011-11-09 NOTE — Telephone Encounter (Signed)
Patient notified as instructed by telephone. Patient states that she does not think that seeing a nutritionist will help her at all. Patient stated that she is not able to sleep at night and can not understand why she is having to take all the potassium pills that she has to daily to keep that in the normal low range. Patient states that she is getting frustrated with not knowing what is going on with her and she feel that something is wrong.

## 2011-11-09 NOTE — Telephone Encounter (Signed)
I called pt.  We can change to kdur to have her taking less pills.  Will recheck K 1 week after the change.  rx sent.  She can take 1-2 of the pot gluconate qhs prn cramps.  She may have overall deficit of K after the colectomy, though this isn't certain.  It does help with the cramps.    I don't know what to do about her sleep.  I rec f/u with sleep clinic via neuro at Dr. Alena Bills' clinic, or f/u with Dr. Sandria Manly.   Per patient report she had hiatal hernia documented on barium swallow.  She has f/u with ENT pending.    Please mail rx to pt for recheck [facility].  Thanks.

## 2011-11-09 NOTE — Telephone Encounter (Signed)
I have no reason to suspect a thyroid disorder or order another thyroid test.  We can set her up with nutrition to help with dietary planning.  I will await ENT input re: cough.

## 2011-11-09 NOTE — Telephone Encounter (Signed)
Patient notified as instructed by telephone. Patient stated that she is concerned because she is still having all of the symptoms that she had when she came to the office. Patient states that she is tired and is gaining weight and her diet has not changed. Patient wants to know if there is another thyroid test that you can order for her?

## 2011-11-10 NOTE — Telephone Encounter (Signed)
Potassium order mailed to pt as instructed.

## 2011-11-27 ENCOUNTER — Encounter: Payer: Self-pay | Admitting: Family Medicine

## 2011-12-08 ENCOUNTER — Other Ambulatory Visit: Payer: Self-pay | Admitting: Family Medicine

## 2011-12-24 ENCOUNTER — Other Ambulatory Visit: Payer: Self-pay | Admitting: Family Medicine

## 2011-12-24 NOTE — Telephone Encounter (Signed)
Ok to refill 

## 2011-12-25 NOTE — Telephone Encounter (Signed)
Sent!

## 2012-02-08 ENCOUNTER — Telehealth: Payer: Self-pay | Admitting: Family Medicine

## 2012-02-08 DIAGNOSIS — R232 Flushing: Secondary | ICD-10-CM

## 2012-02-08 NOTE — Telephone Encounter (Signed)
Referral is in. Thanks.

## 2012-02-08 NOTE — Telephone Encounter (Signed)
Pt called in requesting a referral to a Dr. Frances Nickels. Dr. Nonie Hoyer is an endocrinologist.  Her issue of being hot and cold has not been resolved and she's hoping Dr. Jacqlyn Larsen can help. Dr. Nonie Hoyer is in [facility] and his # is [phone]. Can you refer her? Thank you.

## 2012-06-01 ENCOUNTER — Telehealth: Payer: Self-pay

## 2012-06-01 DIAGNOSIS — R509 Fever, unspecified: Secondary | ICD-10-CM

## 2012-06-01 NOTE — Telephone Encounter (Signed)
She has seen ID prev.  I will refer again based on her preference.

## 2012-06-01 NOTE — Telephone Encounter (Signed)
Pt request referral to infectious disease specialist in [facility]; Dr Roberts Gaudy. Angela Dudley. [address] [facility], [place]. Phone: [phone].Pt said since 2002 pt has had on and off reoccurring fevers. Last episode of fever was 3 weeks ago; today pt said having muscle pain and skin hurts. Pt request note to go to Dr Para March only and will wait to hear answer. Pt said she has discussed previously with Dr Para March and does not want to come in for appt; just wants referral.

## 2012-06-06 ENCOUNTER — Ambulatory Visit (INDEPENDENT_AMBULATORY_CARE_PROVIDER_SITE_OTHER): Payer: Medicare Other | Admitting: Family Medicine

## 2012-06-06 ENCOUNTER — Encounter: Payer: Self-pay | Admitting: Family Medicine

## 2012-06-06 VITALS — BP 130/70 | HR 92 | Temp 98.0°F | Wt 229.0 lb

## 2012-06-06 DIAGNOSIS — R5382 Chronic fatigue, unspecified: Secondary | ICD-10-CM

## 2012-06-06 NOTE — Progress Notes (Signed)
She had another episode starting about 1 month ago.  She had been working in the yard and then had a fever for 3 days, up to 100.4.  Her skin hurt, she didn't feel well.  She had neck stiffness for a few days, but this resolved.  She had episodic nausea.  She typically has these episodes in the summer but hadn't had one in the last few years.  She called about seeing Dr. Daiva Eves with ID but wasn't able to get in.  His prev note re: w/u for still neck has been reviewed.  She has no known recent tick bites.  Last fever was about 1 month ago.  We discussed this all in detail.    She continues to have a cough, though it has improved recently.  She had seen ENT (I don't have records yet) and per patient allergy referral was going to come through the ENT.  She typically has a cough after intubation, and she had this done with nasal surgery in 2/14.  I have no surgical records.   Meds, vitals, and allergies reviewed.   ROS: See HPI.  Otherwise, noncontributory.  nad ncat Obese Mmm Neck supple, no LA, not stiff rrr ctab abd soft Ext w/o edema Speech and gait wnl.

## 2012-06-06 NOTE — Patient Instructions (Addendum)
Call the ENT clinic about the allergy referral.  I would try to get the appointment with Dr. Crisoforo Oxford to get a second opinion on the LP.

## 2012-06-07 NOTE — Assessment & Plan Note (Signed)
Long discussion with patient.  CFS/fibromyalgia/sarcoid could cause this set of symptoms.   No sign of tick related illness.  She appears improved now.  Advised pt to f/u with ID re: prev recs, esp since she had a fever with this episode, though now resolved.  She declined f/u with Dr. Daiva Eves and wants second opinion.  I would like ID input on utility of LP per Dr. Clinton Gallant recs.  >25 min spent with face to face with patient, >50% counseling and/or coordinating care.

## 2012-06-08 ENCOUNTER — Other Ambulatory Visit: Payer: Self-pay | Admitting: Family Medicine

## 2012-06-09 ENCOUNTER — Telehealth: Payer: Self-pay

## 2012-06-09 ENCOUNTER — Telehealth: Payer: Self-pay | Admitting: Family Medicine

## 2012-06-09 NOTE — Telephone Encounter (Signed)
Call pt.  2 issues here.  Doxy isn't the typical abx for diverticulitis. Also, if she is concerned about diverticulitis she needs to be evaluated today, either here or at UC closer to home.  I wouldn't call in the abx w/o seeing her or having her seen elsewhere.

## 2012-06-09 NOTE — Telephone Encounter (Signed)
Pt was seen 06/06/12 and pt forgot to tell Dr Para March that for several days (pt cannot put # on how many days) pt has had normal formed stools but averaging 8-10 BMs a day. Pt has dull pain in LLQ;pain level now 4. Pt has been eating more fruit and taking magnesium for her muscles. Pt thinks may be related to diverticulosis. Pt said in past has been given Doxycycline and that cleared symptoms. No fever now.Pt request Doxycycline sent to [facility] and pt request cb.Please advise.

## 2012-06-09 NOTE — Telephone Encounter (Signed)
Patient advised.  Did not want to schedule appt.

## 2012-06-09 NOTE — Telephone Encounter (Signed)
See following note. 

## 2012-06-09 NOTE — Telephone Encounter (Signed)
Caller: Angela Dudley/Patient; Phone: [phone]; Reason for Call: Pt states she was seen in the office 06/06/12 and no diagnosis.  Pt states she feels this my be diverticulitis and would Dr Para March call her in Rx for doxycycline?  PLEASE F/U WITH PT, THANK YOU.

## 2012-08-18 ENCOUNTER — Telehealth: Payer: Self-pay | Admitting: Neurology

## 2012-08-18 NOTE — Telephone Encounter (Signed)
Pt is transfer of care Dr. Sandria Manly needs to be reassigned a new Dr. and needs to be called back to schedule with new Dr. for an apt.

## 2012-08-22 NOTE — Telephone Encounter (Signed)
I called patient and let her know that S. Young, RN will reassign patient and reschedule. Roxy Cedar will call with new appointment. Patient stated that she feels she has a pinched nerve in her back and this has been an on-going neurological issue. I let her know that it is best if she see a primary physician and be referred back to [place] if the physician feels it is appropriate. I will ask Ms. Young to assign patient and see if we can get her in to see a NP shortly.

## 2012-08-25 NOTE — Telephone Encounter (Signed)
I called pt yesterday.  Spoke to her about her sx.  She has had previous back problems before.  She was working outside and hurt her back.  This happened about 2 mo ago.  Pain has gotten better then worse again.   Notes some numbness/pain in leg.  I told her to acutely see pcp, whom she states will usually does nothing then refers out, in [place].  I would ask WID.  I did not have any availability for this pt at this time.

## 2012-08-30 ENCOUNTER — Ambulatory Visit (INDEPENDENT_AMBULATORY_CARE_PROVIDER_SITE_OTHER): Payer: Medicare Other | Admitting: Family Medicine

## 2012-08-30 ENCOUNTER — Encounter: Payer: Self-pay | Admitting: Family Medicine

## 2012-08-30 ENCOUNTER — Ambulatory Visit (INDEPENDENT_AMBULATORY_CARE_PROVIDER_SITE_OTHER)
Admission: RE | Admit: 2012-08-30 | Discharge: 2012-08-30 | Disposition: A | Discharge status: Home / Self Care | Payer: Medicare Other | Source: Ambulatory Visit | Attending: Family Medicine | Admitting: Family Medicine

## 2012-08-30 VITALS — BP 122/70 | HR 78 | Temp 97.7°F | Wt 231.8 lb

## 2012-08-30 DIAGNOSIS — M549 Dorsalgia, unspecified: Secondary | ICD-10-CM

## 2012-08-30 MED ORDER — TRAMADOL HCL 50 MG PO TABS: 25.0000 mg | ORAL_TABLET | Freq: Four times a day (QID) | ORAL | Status: DC | PRN

## 2012-08-30 NOTE — Assessment & Plan Note (Signed)
Try half dose of tramadol for pain to limit sedation.  Continue stretching.  Check plain films today given the duration.  She may need MRI given the prev paresthesias on the L leg and the duration of sx.  She agrees.  Okay for outpatient f/u.

## 2012-08-30 NOTE — Patient Instructions (Addendum)
Go to the lab on the way out.  We'll contact you with your xray report. Take the 1/2 dose of tramadol in the meantime.   If not improving soon, then we may need to get an MRI of your back.  Take care.

## 2012-08-30 NOTE — Telephone Encounter (Signed)
I called pt today after speaking with Dr. Pearlean Brownie on Friday.  Pt may see pcp acutely for her back pain which she aggravated by some outside work.  If re-referral needed, we would be glad to accomodate.    Last note per Dr. Sandria Manly, see prn.

## 2012-08-30 NOTE — Progress Notes (Signed)
Back pain.  New problem.  Was in the front yard, leaned over, and then had pain.  Started about 2 months ago.  Not relieved in meantime.  Walking in pain, when rolling over in bed.  Lower midline pain. Prev had some radiation to the hips, now with some radiation to the L lower back when she rolls over in bed.  She has some relief when she leans over a counter for a period of time. She is trying to use leg exercises and stretching.  No weakness.  Pain is getting worse.  She has numbness in L leg and foot.  Pain is the most bothersome issue for patient.  She has been icing her back.  No loss of bowel or bladder control.  Taking tylenol and tramadol for pain.    PMH and SH reviewed  ROS: See HPI, otherwise noncontributory.  Meds, vitals, and allergies reviewed.   nad but uncomfortable Mmm rrr ctab Back w/o midline pain except in lower midline L spine.  No pain with facet loading or SI testing B Pain improved with forward flexion.  Strength wnl in BLE, equal.  No gross deficit to sensation on testing BLE.  Gait slow with limb from back pain.

## 2012-08-31 ENCOUNTER — Other Ambulatory Visit: Payer: Self-pay | Admitting: *Deleted

## 2012-08-31 ENCOUNTER — Other Ambulatory Visit: Payer: Self-pay | Admitting: Family Medicine

## 2012-08-31 DIAGNOSIS — M549 Dorsalgia, unspecified: Secondary | ICD-10-CM

## 2012-08-31 MED ORDER — HYDROCODONE-ACETAMINOPHEN 5-325 MG PO TABS: 1.0000 | ORAL_TABLET | Freq: Three times a day (TID) | ORAL | Status: DC | PRN

## 2012-08-31 NOTE — Progress Notes (Signed)
Pt declined the hydrocodone rx.  She can take plain OTC tylenol.

## 2012-10-31 ENCOUNTER — Other Ambulatory Visit: Payer: Self-pay

## 2012-10-31 MED ORDER — CARISOPRODOL 350 MG PO TABS: ORAL_TABLET | ORAL | Status: DC

## 2012-10-31 NOTE — Telephone Encounter (Signed)
Please call in

## 2012-10-31 NOTE — Telephone Encounter (Signed)
Pt request refill for soma [facility]; pt saw Dr Para March in 08/2012 for back pain; pt got injection in back by Dr Modesto Charon at [facility]. Pt said she had soma at home and that has helped the back pain.Please advise.

## 2012-11-01 NOTE — Telephone Encounter (Signed)
Medication phoned to pharmacy.  

## 2012-11-11 MED ORDER — CARISOPRODOL 350 MG PO TABS: ORAL_TABLET | ORAL | Status: DC

## 2012-11-11 NOTE — Addendum Note (Signed)
Addended by: Joaquim Nam on: 11/11/2012 12:56 PM   Modules accepted: Orders

## 2012-11-11 NOTE — Telephone Encounter (Signed)
Medication phoned to pharmacy.  

## 2012-11-11 NOTE — Telephone Encounter (Signed)
Pt left v/m; having muscle cramping and pt has tried taking muscle relaxant once daily; pt thinks needs to take twice a day due to back pain at night time.Please advise.

## 2012-11-11 NOTE — Telephone Encounter (Signed)
Please call in.  Thanks.   

## 2013-01-05 HISTORY — PX: BACK SURGERY: SHX140

## 2013-01-11 ENCOUNTER — Other Ambulatory Visit: Payer: Self-pay | Admitting: Family Medicine

## 2013-03-04 ENCOUNTER — Other Ambulatory Visit: Payer: Self-pay | Admitting: Family Medicine

## 2013-03-06 NOTE — Telephone Encounter (Signed)
Please call in.  Thanks.   

## 2013-03-06 NOTE — Telephone Encounter (Signed)
Received refill request electronically. Last refill 11/11/12 #60/1 refill, last office visit 08/30/12. Is it okay to refill medication?

## 2013-03-07 NOTE — Telephone Encounter (Signed)
Rx called to pharmacy as instructed. 

## 2013-05-30 ENCOUNTER — Other Ambulatory Visit: Payer: Self-pay | Admitting: Family Medicine

## 2013-05-30 NOTE — Telephone Encounter (Signed)
Electronic refill request.  Last Filled:   90 tablet 3 RF on   06/08/2012.  No labs since 11/26/11.  Please advise.

## 2013-05-31 NOTE — Telephone Encounter (Signed)
Left message on answering machine to call back.

## 2013-05-31 NOTE — Telephone Encounter (Signed)
Done as instructed.

## 2013-05-31 NOTE — Telephone Encounter (Signed)
Thanks.  I wish her well.  Please take me off the PCP list.

## 2013-05-31 NOTE — Telephone Encounter (Signed)
Spoke to patient by telephone and was advised that she has changed to Dr. Martin Majestic in [place] which is closer to her home. Patient stated that she does not know why the pharmacy sent the request to Dr. Para March. Called the pharmacy and cancelled the refills after speaking with patient.

## 2013-05-31 NOTE — Telephone Encounter (Signed)
Needs a CPE set up when feasible.  Sent.

## 2013-10-05 ENCOUNTER — Other Ambulatory Visit: Payer: Self-pay | Admitting: Neurological Surgery

## 2013-10-06 ENCOUNTER — Encounter (HOSPITAL_COMMUNITY): Payer: Self-pay | Admitting: Pharmacy Technician

## 2013-10-11 NOTE — Pre-Procedure Instructions (Signed)
Sandi MealyMary B Sami  10/11/2013   Your procedure is scheduled on:  Monday October 16, 2013 at 1:13 PM.  [facility] at 10:00 AM.  Call this number if you have problems the morning of surgery: [phone]   Remember:   Do not eat food or drink liquids after midnight.   Take these medicines the morning of surgery with A SIP OF WATER: Thyroid ([place])   Discontinue aspirin and herbal medications 7 days prior to surgery   Do not wear jewelry, make-up or nail polish.  Do not wear lotions, powders, or perfumes.   Do not shave 48 hours prior to surgery.   Do not bring valuables to the hospital.  [facility] is not responsible for any belongings or valuables.               Contacts, dentures or bridgework may not be worn into surgery.  Leave suitcase in the car. After surgery it may be brought to your room.  For patients admitted to the hospital, discharge time is determined by your treatment team.               Patients discharged the day of surgery will not be allowed to drive home.  Name and phone number of your driver: Family/Friend  Special Instructions: Shower using CHG soap the night before and the morning of your surgery   Please read over the following fact sheets that you were given: Pain Booklet, Coughing and Deep Breathing, MRSA Information and Surgical Site Infection Prevention

## 2013-10-12 ENCOUNTER — Encounter (HOSPITAL_COMMUNITY)
Admission: RE | Admit: 2013-10-12 | Discharge: 2013-10-12 | Disposition: A | Discharge status: Home / Self Care | Payer: Medicare Other | Source: Ambulatory Visit | Attending: Neurological Surgery | Admitting: Neurological Surgery

## 2013-10-12 ENCOUNTER — Encounter (HOSPITAL_COMMUNITY): Payer: Self-pay

## 2013-10-12 DIAGNOSIS — K5730 Diverticulosis of large intestine without perforation or abscess without bleeding: Secondary | ICD-10-CM | POA: Diagnosis not present

## 2013-10-12 DIAGNOSIS — E538 Deficiency of other specified B group vitamins: Secondary | ICD-10-CM | POA: Diagnosis not present

## 2013-10-12 DIAGNOSIS — M5002 Cervical disc disorder with myelopathy, mid-cervical region: Secondary | ICD-10-CM | POA: Diagnosis not present

## 2013-10-12 DIAGNOSIS — E039 Hypothyroidism, unspecified: Secondary | ICD-10-CM | POA: Insufficient documentation

## 2013-10-12 DIAGNOSIS — R1084 Generalized abdominal pain: Secondary | ICD-10-CM | POA: Diagnosis not present

## 2013-10-12 DIAGNOSIS — G608 Other hereditary and idiopathic neuropathies: Secondary | ICD-10-CM | POA: Insufficient documentation

## 2013-10-12 DIAGNOSIS — K660 Peritoneal adhesions (postprocedural) (postinfection): Secondary | ICD-10-CM | POA: Insufficient documentation

## 2013-10-12 DIAGNOSIS — M5001 Cervical disc disorder with myelopathy,  high cervical region: Secondary | ICD-10-CM | POA: Insufficient documentation

## 2013-10-12 DIAGNOSIS — M1990 Unspecified osteoarthritis, unspecified site: Secondary | ICD-10-CM | POA: Diagnosis not present

## 2013-10-12 DIAGNOSIS — Z0000 Encounter for general adult medical examination without abnormal findings: Secondary | ICD-10-CM | POA: Diagnosis present

## 2013-10-12 DIAGNOSIS — R5382 Chronic fatigue, unspecified: Secondary | ICD-10-CM | POA: Insufficient documentation

## 2013-10-12 DIAGNOSIS — K279 Peptic ulcer, site unspecified, unspecified as acute or chronic, without hemorrhage or perforation: Secondary | ICD-10-CM | POA: Insufficient documentation

## 2013-10-12 DIAGNOSIS — D869 Sarcoidosis, unspecified: Secondary | ICD-10-CM | POA: Insufficient documentation

## 2013-10-12 DIAGNOSIS — K5660 Unspecified intestinal obstruction: Secondary | ICD-10-CM | POA: Diagnosis not present

## 2013-10-12 DIAGNOSIS — K219 Gastro-esophageal reflux disease without esophagitis: Secondary | ICD-10-CM | POA: Diagnosis not present

## 2013-10-12 DIAGNOSIS — R252 Cramp and spasm: Secondary | ICD-10-CM | POA: Diagnosis not present

## 2013-10-12 DIAGNOSIS — E785 Hyperlipidemia, unspecified: Secondary | ICD-10-CM | POA: Insufficient documentation

## 2013-10-12 DIAGNOSIS — E669 Obesity, unspecified: Secondary | ICD-10-CM | POA: Insufficient documentation

## 2013-10-12 HISTORY — DX: Anxiety disorder, unspecified: F41.9

## 2013-10-12 HISTORY — DX: Paresthesia of skin: R20.0

## 2013-10-12 HISTORY — DX: Urinary tract infection, site not specified: N39.0

## 2013-10-12 HISTORY — DX: Personal history of other diseases of the digestive system: Z87.19

## 2013-10-12 HISTORY — DX: Dizziness and giddiness: R42

## 2013-10-12 HISTORY — DX: Nocturia: R35.1

## 2013-10-12 HISTORY — DX: Hypothyroidism, unspecified: E03.9

## 2013-10-12 HISTORY — DX: Shortness of breath: R06.02

## 2013-10-12 HISTORY — DX: Fibromyalgia: M79.7

## 2013-10-12 HISTORY — DX: Unspecified hemorrhoids: K64.9

## 2013-10-12 HISTORY — DX: Irritable bowel syndrome, unspecified: K58.9

## 2013-10-12 HISTORY — DX: Paresthesia of skin: R20.2

## 2013-10-12 HISTORY — DX: Diaphragmatic hernia without obstruction or gangrene: K44.9

## 2013-10-12 LAB — CBC
HCT: 43.5 % (ref 36.0–46.0)
Hemoglobin: 14.6 g/dL (ref 12.0–15.0)
MCH: 30.5 pg (ref 26.0–34.0)
MCHC: 33.6 g/dL (ref 30.0–36.0)
MCV: 91 fL (ref 78.0–100.0)
PLATELETS: 214 10*3/uL (ref 150–400)
RBC: 4.78 MIL/uL (ref 3.87–5.11)
RDW: 13.6 % (ref 11.5–15.5)
WBC: 7.2 10*3/uL (ref 4.0–10.5)

## 2013-10-12 LAB — BASIC METABOLIC PANEL
ANION GAP: 13 (ref 5–15)
BUN: 15 mg/dL (ref 6–23)
CO2: 28 mEq/L (ref 19–32)
Calcium: 9.4 mg/dL (ref 8.4–10.5)
Chloride: 103 mEq/L (ref 96–112)
Creatinine, Ser: 1.06 mg/dL (ref 0.50–1.10)
GFR calc non Af Amer: 53 mL/min — ABNORMAL LOW (ref >90)
GFR, EST AFRICAN AMERICAN: 62 mL/min — AB (ref >90)
Glucose, Bld: 100 mg/dL — ABNORMAL HIGH (ref 70–99)
POTASSIUM: 4.3 meq/L (ref 3.7–5.3)
Sodium: 144 mEq/L (ref 137–147)

## 2013-10-12 LAB — SURGICAL PCR SCREEN
MRSA, PCR: NEGATIVE
Staphylococcus aureus: NEGATIVE

## 2013-10-12 NOTE — Progress Notes (Signed)
Patient informed Nurse that she had a stress test with Dr. Armanda Magicraci Turner at [facility], but currently sees [place] as he is in closer distance to her. Will request records. Chest xray was found under the "care everywhere" tab in EPIC on 01/13/13. Patient denied having any acute cardiac or pulmonary issues at this time.

## 2013-10-12 NOTE — Progress Notes (Signed)
Patient stated that Dr. Danielle DessElsner did not inform her of risks, complications etc of the procedure and would like to sign consent form after she talks with him DOS.

## 2013-10-12 NOTE — Progress Notes (Signed)
Nurse explained to patient that she needed to remain NPO after MN. Patient stated that she was informed by Dr. Verlee RossettiElsner's office that she could eat up until 0500. Nurse explained to patient that per Anesthesia she was not to have any food or drink after MN. Nurse also instructed patient that her animals were not allowed to sleep with her starting Sunday night after she completes her first shower and places clean sheets on bed. Patient appeared to be bothered by request and stated that her animals sleep "on top of the covers and not in her bed." Nurse explained to patient that animals carry a great deal of bacteria around and that this was for her own good with hopes of preventing any infections after surgery.

## 2013-10-15 MED ORDER — VANCOMYCIN HCL 10 G IV SOLR
1500.0000 mg | INTRAVENOUS | Status: AC
  Administered 2013-10-16: 1500 mg via INTRAVENOUS
  Filled 2013-10-15: qty 1500

## 2013-10-16 ENCOUNTER — Encounter (HOSPITAL_COMMUNITY): Payer: Self-pay | Admitting: *Deleted

## 2013-10-16 ENCOUNTER — Encounter (HOSPITAL_COMMUNITY): Payer: Medicare Other | Admitting: Critical Care Medicine

## 2013-10-16 ENCOUNTER — Inpatient Hospital Stay (HOSPITAL_COMMUNITY): Admission: RE | Admit: 2013-10-16 | Payer: Medicare Other | Source: Ambulatory Visit | Admitting: Neurological Surgery

## 2013-10-16 ENCOUNTER — Inpatient Hospital Stay (HOSPITAL_COMMUNITY): Payer: Medicare Other | Admitting: Critical Care Medicine

## 2013-10-16 ENCOUNTER — Inpatient Hospital Stay (HOSPITAL_COMMUNITY): Payer: Medicare Other

## 2013-10-16 ENCOUNTER — Encounter (HOSPITAL_COMMUNITY)
Admission: RE | Disposition: A | Discharge status: Home / Self Care | Payer: Self-pay | Source: Ambulatory Visit | Attending: Neurological Surgery

## 2013-10-16 ENCOUNTER — Encounter (HOSPITAL_COMMUNITY): Admission: RE | Payer: Self-pay | Source: Ambulatory Visit

## 2013-10-16 ENCOUNTER — Observation Stay (HOSPITAL_COMMUNITY)
Admission: RE | Admit: 2013-10-16 | Discharge: 2013-10-17 | DRG: 473 | Disposition: A | Discharge status: Home / Self Care | Payer: Medicare Other | Source: Ambulatory Visit | Attending: Neurological Surgery | Admitting: Neurological Surgery

## 2013-10-16 DIAGNOSIS — R51 Headache: Secondary | ICD-10-CM | POA: Insufficient documentation

## 2013-10-16 DIAGNOSIS — M4722 Other spondylosis with radiculopathy, cervical region: Principal | ICD-10-CM | POA: Insufficient documentation

## 2013-10-16 DIAGNOSIS — M797 Fibromyalgia: Secondary | ICD-10-CM | POA: Diagnosis not present

## 2013-10-16 DIAGNOSIS — K219 Gastro-esophageal reflux disease without esophagitis: Secondary | ICD-10-CM | POA: Insufficient documentation

## 2013-10-16 DIAGNOSIS — F419 Anxiety disorder, unspecified: Secondary | ICD-10-CM | POA: Diagnosis not present

## 2013-10-16 DIAGNOSIS — M5011 Cervical disc disorder with radiculopathy,  high cervical region: Secondary | ICD-10-CM | POA: Diagnosis not present

## 2013-10-16 DIAGNOSIS — E039 Hypothyroidism, unspecified: Secondary | ICD-10-CM | POA: Insufficient documentation

## 2013-10-16 DIAGNOSIS — M1990 Unspecified osteoarthritis, unspecified site: Secondary | ICD-10-CM | POA: Diagnosis not present

## 2013-10-16 DIAGNOSIS — K449 Diaphragmatic hernia without obstruction or gangrene: Secondary | ICD-10-CM | POA: Insufficient documentation

## 2013-10-16 DIAGNOSIS — M4802 Spinal stenosis, cervical region: Secondary | ICD-10-CM | POA: Diagnosis not present

## 2013-10-16 DIAGNOSIS — R0602 Shortness of breath: Secondary | ICD-10-CM | POA: Insufficient documentation

## 2013-10-16 DIAGNOSIS — M5012 Cervical disc disorder with radiculopathy, mid-cervical region: Secondary | ICD-10-CM | POA: Insufficient documentation

## 2013-10-16 DIAGNOSIS — M5000 Cervical disc disorder with myelopathy, unspecified cervical region: Secondary | ICD-10-CM

## 2013-10-16 HISTORY — PX: ANTERIOR CERVICAL DECOMP/DISCECTOMY FUSION: SHX1161

## 2013-10-16 SURGERY — ANTERIOR CERVICAL DECOMPRESSION/DISCECTOMY FUSION 2 LEVELS
Anesthesia: General

## 2013-10-16 SURGERY — ANTERIOR CERVICAL DECOMPRESSION/DISCECTOMY FUSION 2 LEVELS
Anesthesia: General | Site: Neck

## 2013-10-16 MED ORDER — HYDROMORPHONE HCL 1 MG/ML IJ SOLN
0.2500 mg | INTRAMUSCULAR | Status: DC | PRN
  Administered 2013-10-16: 0.5 mg via INTRAVENOUS

## 2013-10-16 MED ORDER — PHENOL 1.4 % MT LIQD
1.0000 | OROMUCOSAL | Status: DC | PRN
Start: 2013-10-16 — End: 2013-10-17

## 2013-10-16 MED ORDER — MENTHOL 3 MG MT LOZG: 1.0000 | LOZENGE | OROMUCOSAL | Status: DC | PRN

## 2013-10-16 MED ORDER — SODIUM CHLORIDE 0.9 % IJ SOLN
3.0000 mL | Freq: Two times a day (BID) | INTRAMUSCULAR | Status: DC
Start: 2013-10-16 — End: 2013-10-17
  Administered 2013-10-16: 3 mL via INTRAVENOUS

## 2013-10-16 MED ORDER — IPRATROPIUM BROMIDE 0.03 % NA SOLN
2.0000 | Freq: Two times a day (BID) | NASAL | Status: DC
  Filled 2013-10-16: qty 30

## 2013-10-16 MED ORDER — NEOSTIGMINE METHYLSULFATE 10 MG/10ML IV SOLN
INTRAVENOUS | Status: DC | PRN
  Administered 2013-10-16: 3 mg via INTRAVENOUS

## 2013-10-16 MED ORDER — THYROID 60 MG PO TABS
60.0000 mg | ORAL_TABLET | Freq: Every day | ORAL | Status: DC
  Administered 2013-10-17: 60 mg via ORAL
  Filled 2013-10-16 (×2): qty 1

## 2013-10-16 MED ORDER — ROCURONIUM BROMIDE 100 MG/10ML IV SOLN
INTRAVENOUS | Status: DC | PRN
  Administered 2013-10-16: 30 mg via INTRAVENOUS

## 2013-10-16 MED ORDER — OXYCODONE HCL 5 MG/5ML PO SOLN: 5.0000 mg | Freq: Once | ORAL | Status: DC | PRN

## 2013-10-16 MED ORDER — FENTANYL CITRATE 0.05 MG/ML IJ SOLN
INTRAMUSCULAR | Status: AC
  Filled 2013-10-16: qty 5

## 2013-10-16 MED ORDER — THROMBIN 5000 UNITS EX SOLR
CUTANEOUS | Status: DC | PRN
  Administered 2013-10-16 (×2): 5000 [IU] via TOPICAL

## 2013-10-16 MED ORDER — POLYETHYLENE GLYCOL 3350 17 G PO PACK
17.0000 g | PACK | Freq: Every day | ORAL | Status: DC | PRN
Start: 2013-10-16 — End: 2013-10-17
  Filled 2013-10-16: qty 1

## 2013-10-16 MED ORDER — ONDANSETRON HCL 4 MG/2ML IJ SOLN
INTRAMUSCULAR | Status: DC | PRN
  Administered 2013-10-16: 4 mg via INTRAVENOUS

## 2013-10-16 MED ORDER — FENTANYL CITRATE 0.05 MG/ML IJ SOLN
INTRAMUSCULAR | Status: DC | PRN
  Administered 2013-10-16 (×3): 50 ug via INTRAVENOUS
  Administered 2013-10-16: 100 ug via INTRAVENOUS

## 2013-10-16 MED ORDER — ALUM & MAG HYDROXIDE-SIMETH 200-200-20 MG/5ML PO SUSP
30.0000 mL | Freq: Four times a day (QID) | ORAL | Status: DC | PRN
Start: 2013-10-16 — End: 2013-10-17

## 2013-10-16 MED ORDER — MIDAZOLAM HCL 5 MG/5ML IJ SOLN
INTRAMUSCULAR | Status: DC | PRN
  Administered 2013-10-16: 2 mg via INTRAVENOUS

## 2013-10-16 MED ORDER — METHOCARBAMOL 500 MG PO TABS
500.0000 mg | ORAL_TABLET | Freq: Four times a day (QID) | ORAL | Status: DC | PRN
  Administered 2013-10-16: 500 mg via ORAL
  Filled 2013-10-16: qty 1

## 2013-10-16 MED ORDER — BUPIVACAINE HCL (PF) 0.5 % IJ SOLN
INTRAMUSCULAR | Status: DC | PRN
  Administered 2013-10-16: 4.5 mL

## 2013-10-16 MED ORDER — GLYCOPYRROLATE 0.2 MG/ML IJ SOLN
INTRAMUSCULAR | Status: DC | PRN
  Administered 2013-10-16: 0.4 mg via INTRAVENOUS

## 2013-10-16 MED ORDER — LIDOCAINE HCL 4 % MT SOLN
OROMUCOSAL | Status: DC | PRN
  Administered 2013-10-16: 4 mL via TOPICAL

## 2013-10-16 MED ORDER — PROPOFOL 10 MG/ML IV BOLUS
INTRAVENOUS | Status: AC
  Filled 2013-10-16: qty 20

## 2013-10-16 MED ORDER — OXYCODONE HCL 5 MG PO TABS: 5.0000 mg | ORAL_TABLET | Freq: Once | ORAL | Status: DC | PRN

## 2013-10-16 MED ORDER — POTASSIUM CHLORIDE CRYS ER 20 MEQ PO TBCR
20.0000 meq | EXTENDED_RELEASE_TABLET | Freq: Every day | ORAL | Status: DC
Start: 2013-10-16 — End: 2013-10-17
  Administered 2013-10-16 – 2013-10-17 (×2): 20 meq via ORAL
  Filled 2013-10-16 (×2): qty 1

## 2013-10-16 MED ORDER — ACETAMINOPHEN 650 MG RE SUPP: 650.0000 mg | RECTAL | Status: DC | PRN

## 2013-10-16 MED ORDER — LIDOCAINE-EPINEPHRINE 1 %-1:100000 IJ SOLN
INTRAMUSCULAR | Status: DC | PRN
  Administered 2013-10-16: 4.5 mL

## 2013-10-16 MED ORDER — LIDOCAINE HCL (CARDIAC) 20 MG/ML IV SOLN
INTRAVENOUS | Status: DC | PRN
  Administered 2013-10-16: 80 mg via INTRAVENOUS

## 2013-10-16 MED ORDER — BISACODYL 10 MG RE SUPP
10.0000 mg | Freq: Every day | RECTAL | Status: DC | PRN
Start: 2013-10-16 — End: 2013-10-17

## 2013-10-16 MED ORDER — ONDANSETRON HCL 4 MG/2ML IJ SOLN: 4.0000 mg | INTRAMUSCULAR | Status: DC | PRN

## 2013-10-16 MED ORDER — MEPERIDINE HCL 25 MG/ML IJ SOLN: 6.2500 mg | INTRAMUSCULAR | Status: DC | PRN

## 2013-10-16 MED ORDER — DOCUSATE SODIUM 100 MG PO CAPS
100.0000 mg | ORAL_CAPSULE | Freq: Two times a day (BID) | ORAL | Status: DC
  Administered 2013-10-16 – 2013-10-17 (×2): 100 mg via ORAL
  Filled 2013-10-16 (×3): qty 1

## 2013-10-16 MED ORDER — ACETAMINOPHEN 325 MG PO TABS
650.0000 mg | ORAL_TABLET | ORAL | Status: DC | PRN
  Administered 2013-10-16 – 2013-10-17 (×2): 650 mg via ORAL
  Filled 2013-10-16 (×2): qty 2

## 2013-10-16 MED ORDER — SUCCINYLCHOLINE CHLORIDE 20 MG/ML IJ SOLN
INTRAMUSCULAR | Status: DC | PRN
  Administered 2013-10-16: 150 mg via INTRAVENOUS

## 2013-10-16 MED ORDER — PROPOFOL 10 MG/ML IV BOLUS
INTRAVENOUS | Status: DC | PRN
  Administered 2013-10-16: 150 mg via INTRAVENOUS
  Administered 2013-10-16 (×2): 20 mg via INTRAVENOUS

## 2013-10-16 MED ORDER — MORPHINE SULFATE 2 MG/ML IJ SOLN: 1.0000 mg | INTRAMUSCULAR | Status: DC | PRN

## 2013-10-16 MED ORDER — PHENYLEPHRINE HCL 10 MG/ML IJ SOLN
INTRAMUSCULAR | Status: DC | PRN
  Administered 2013-10-16 (×4): 40 ug via INTRAVENOUS

## 2013-10-16 MED ORDER — LACTATED RINGERS IV SOLN: INTRAVENOUS | Status: DC

## 2013-10-16 MED ORDER — OXYCODONE-ACETAMINOPHEN 5-325 MG PO TABS
ORAL_TABLET | ORAL | Status: AC
  Filled 2013-10-16: qty 2

## 2013-10-16 MED ORDER — SENNA 8.6 MG PO TABS
1.0000 | ORAL_TABLET | Freq: Two times a day (BID) | ORAL | Status: DC
  Filled 2013-10-16 (×2): qty 1

## 2013-10-16 MED ORDER — 0.9 % SODIUM CHLORIDE (POUR BTL) OPTIME
TOPICAL | Status: DC | PRN
  Administered 2013-10-16: 1000 mL

## 2013-10-16 MED ORDER — ONDANSETRON HCL 4 MG/2ML IJ SOLN: 4.0000 mg | Freq: Once | INTRAMUSCULAR | Status: DC | PRN

## 2013-10-16 MED ORDER — SODIUM CHLORIDE 0.9 % IR SOLN
Status: DC | PRN
  Administered 2013-10-16: 14:00:00

## 2013-10-16 MED ORDER — SODIUM CHLORIDE 0.9 % IJ SOLN: 3.0000 mL | INTRAMUSCULAR | Status: DC | PRN

## 2013-10-16 MED ORDER — FLUOROMETHOLONE 0.1 % OP SUSP
1.0000 [drp] | Freq: Two times a day (BID) | OPHTHALMIC | Status: DC | PRN
  Filled 2013-10-16: qty 5

## 2013-10-16 MED ORDER — OXYCODONE-ACETAMINOPHEN 5-325 MG PO TABS: 1.0000 | ORAL_TABLET | ORAL | Status: DC | PRN

## 2013-10-16 MED ORDER — METHOCARBAMOL 1000 MG/10ML IJ SOLN
500.0000 mg | Freq: Four times a day (QID) | INTRAVENOUS | Status: DC | PRN
  Filled 2013-10-16: qty 5

## 2013-10-16 MED ORDER — HEMOSTATIC AGENTS (NO CHARGE) OPTIME
TOPICAL | Status: DC | PRN
  Administered 2013-10-16: 1 via TOPICAL

## 2013-10-16 MED ORDER — LACTATED RINGERS IV SOLN
INTRAVENOUS | Status: DC | PRN
  Administered 2013-10-16 (×2): via INTRAVENOUS

## 2013-10-16 MED ORDER — CARISOPRODOL 350 MG PO TABS: 350.0000 mg | ORAL_TABLET | Freq: Two times a day (BID) | ORAL | Status: DC | PRN

## 2013-10-16 MED ORDER — ROCURONIUM BROMIDE 50 MG/5ML IV SOLN
INTRAVENOUS | Status: AC
  Filled 2013-10-16: qty 1

## 2013-10-16 MED ORDER — SUCCINYLCHOLINE CHLORIDE 20 MG/ML IJ SOLN
INTRAMUSCULAR | Status: AC
  Filled 2013-10-16: qty 1

## 2013-10-16 MED ORDER — SODIUM CHLORIDE 0.9 % IV SOLN
INTRAVENOUS | Status: DC | PRN
  Administered 2013-10-16 (×2): via INTRAVENOUS

## 2013-10-16 MED ORDER — THROMBIN 5000 UNITS EX SOLR
OROMUCOSAL | Status: DC | PRN
  Administered 2013-10-16: 14:00:00 via TOPICAL

## 2013-10-16 MED ORDER — METHOCARBAMOL 500 MG PO TABS
ORAL_TABLET | ORAL | Status: AC
  Filled 2013-10-16: qty 1

## 2013-10-16 MED ORDER — HYDROMORPHONE HCL 1 MG/ML IJ SOLN
INTRAMUSCULAR | Status: AC
  Filled 2013-10-16: qty 1

## 2013-10-16 MED ORDER — DEXAMETHASONE SODIUM PHOSPHATE 10 MG/ML IJ SOLN
INTRAMUSCULAR | Status: DC | PRN
  Administered 2013-10-16: 10 mg via INTRAVENOUS

## 2013-10-16 MED ORDER — MIDAZOLAM HCL 2 MG/2ML IJ SOLN
INTRAMUSCULAR | Status: AC
  Filled 2013-10-16: qty 2

## 2013-10-16 SURGICAL SUPPLY — 62 items
ADH SKN CLS APL DERMABOND .7 (GAUZE/BANDAGES/DRESSINGS)
ADH SKN CLS LQ APL DERMABOND (GAUZE/BANDAGES/DRESSINGS) ×1
ALLOGRAFT LORDOTIC 8X11X14 (Bone Implant) ×1 IMPLANT
ALLOGRAFT LORDOTIC CC 7X11X14 (Bone Implant) ×1 IMPLANT
BAG DECANTER FOR FLEXI CONT (MISCELLANEOUS) ×2 IMPLANT
BIT DRILL NEURO 2X3.1 SFT TUCH (MISCELLANEOUS) ×1 IMPLANT
BIT DRILL POWER (BIT) IMPLANT
BNDG GAUZE ELAST 4 BULKY (GAUZE/BANDAGES/DRESSINGS) ×2 IMPLANT
BUR BARREL STRAIGHT FLUTE 4.0 (BURR) ×2 IMPLANT
CANISTER SUCT 3000ML (MISCELLANEOUS) ×2 IMPLANT
CONT SPEC 4OZ CLIKSEAL STRL BL (MISCELLANEOUS) ×2 IMPLANT
DECANTER SPIKE VIAL GLASS SM (MISCELLANEOUS) ×2 IMPLANT
DERMABOND ADHESIVE PROPEN (GAUZE/BANDAGES/DRESSINGS) ×1
DERMABOND ADVANCED (GAUZE/BANDAGES/DRESSINGS)
DERMABOND ADVANCED .7 DNX12 (GAUZE/BANDAGES/DRESSINGS) ×1 IMPLANT
DERMABOND ADVANCED .7 DNX6 (GAUZE/BANDAGES/DRESSINGS) IMPLANT
DRAPE LAPAROTOMY 100X72 PEDS (DRAPES) ×2 IMPLANT
DRAPE MICROSCOPE LEICA (MISCELLANEOUS) IMPLANT
DRAPE POUCH INSTRU U-SHP 10X18 (DRAPES) ×2 IMPLANT
DRILL BIT POWER (BIT) ×2
DRILL NEURO 2X3.1 SOFT TOUCH (MISCELLANEOUS) ×2
DURAPREP 6ML APPLICATOR 50/CS (WOUND CARE) ×2 IMPLANT
ELECT REM PT RETURN 9FT ADLT (ELECTROSURGICAL) ×2
ELECTRODE REM PT RTRN 9FT ADLT (ELECTROSURGICAL) ×1 IMPLANT
GAUZE SPONGE 4X4 12PLY STRL (GAUZE/BANDAGES/DRESSINGS) ×2 IMPLANT
GAUZE SPONGE 4X4 16PLY XRAY LF (GAUZE/BANDAGES/DRESSINGS) IMPLANT
GLOVE BIOGEL PI IND STRL 8 (GLOVE) IMPLANT
GLOVE BIOGEL PI IND STRL 8.5 (GLOVE) ×1 IMPLANT
GLOVE BIOGEL PI INDICATOR 8 (GLOVE) ×3
GLOVE BIOGEL PI INDICATOR 8.5 (GLOVE) ×1
GLOVE ECLIPSE 6.5 STRL STRAW (GLOVE) ×1 IMPLANT
GLOVE ECLIPSE 7.5 STRL STRAW (GLOVE) ×4 IMPLANT
GLOVE ECLIPSE 8.5 STRL (GLOVE) ×2 IMPLANT
GLOVE EXAM NITRILE LRG STRL (GLOVE) IMPLANT
GLOVE EXAM NITRILE MD LF STRL (GLOVE) IMPLANT
GLOVE EXAM NITRILE XL STR (GLOVE) IMPLANT
GLOVE EXAM NITRILE XS STR PU (GLOVE) IMPLANT
GOWN STRL REUS W/ TWL LRG LVL3 (GOWN DISPOSABLE) IMPLANT
GOWN STRL REUS W/ TWL XL LVL3 (GOWN DISPOSABLE) IMPLANT
GOWN STRL REUS W/TWL 2XL LVL3 (GOWN DISPOSABLE) ×4 IMPLANT
GOWN STRL REUS W/TWL LRG LVL3 (GOWN DISPOSABLE) ×2
GOWN STRL REUS W/TWL XL LVL3 (GOWN DISPOSABLE)
HALTER HD/CHIN CERV TRACTION D (MISCELLANEOUS) ×2 IMPLANT
KIT BASIN OR (CUSTOM PROCEDURE TRAY) ×2 IMPLANT
KIT ROOM TURNOVER OR (KITS) ×2 IMPLANT
NDL SPNL 22GX3.5 QUINCKE BK (NEEDLE) ×1 IMPLANT
NEEDLE HYPO 22GX1.5 SAFETY (NEEDLE) ×2 IMPLANT
NEEDLE SPNL 22GX3.5 QUINCKE BK (NEEDLE) ×2 IMPLANT
NS IRRIG 1000ML POUR BTL (IV SOLUTION) ×2 IMPLANT
PACK LAMINECTOMY NEURO (CUSTOM PROCEDURE TRAY) ×2 IMPLANT
PAD ARMBOARD 7.5X6 YLW CONV (MISCELLANEOUS) ×6 IMPLANT
PLATE ARCHON 1-LEVEL 22MM (Plate) ×1 IMPLANT
PLATE ARCHON 24MM 1LVL (Plate) ×1 IMPLANT
RUBBERBAND STERILE (MISCELLANEOUS) IMPLANT
SCREW ARCHON SELFTAP 4.0X13 (Screw) ×8 IMPLANT
SPONGE INTESTINAL PEANUT (DISPOSABLE) ×2 IMPLANT
SPONGE SURGIFOAM ABS GEL SZ50 (HEMOSTASIS) ×2 IMPLANT
SUT VIC AB 3-0 SH 8-18 (SUTURE) ×4 IMPLANT
SYR 20ML ECCENTRIC (SYRINGE) ×2 IMPLANT
TOWEL OR 17X24 6PK STRL BLUE (TOWEL DISPOSABLE) ×2 IMPLANT
TOWEL OR 17X26 10 PK STRL BLUE (TOWEL DISPOSABLE) ×2 IMPLANT
WATER STERILE IRR 1000ML POUR (IV SOLUTION) ×2 IMPLANT

## 2013-10-16 NOTE — Plan of Care (Signed)
Problem: Consults Goal: Diagnosis - Spinal Surgery Outcome: Completed/Met Date Met:  10/16/13 Cervical Spine Fusion     

## 2013-10-16 NOTE — Op Note (Signed)
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 Asst.: Dr. Barbaraann BarthelKyle Cabell M.D. Indications: Patient is a 68 year old individual is had significant cervical radicular symptoms in addition to much centralized neck pain for several years time she has evidence of spondylosis with centrally herniated nucleus pulposus at C3-4 and again at C5-C6 there is slight effacement of the cord but no overt nerve root compromise she. There is some mild lateral recess stenosis. The patient has mostly radiculopathic symptoms in addition to centralized neck pain she's been advised regarding conservative treatments versus surgical treatments and she notes that all conservative efforts that she's put forth have yielded nothing she desires to undergo surgical decompression and arthrodesis at the 2 levels involved in an effort to gain some relief of pain.   Procedure: The patient was brought to the operating room placed on the table in supine position. After the smooth induction of general endotracheal anesthesia neck was placed in 5 pounds of halter traction and prepped with alcohol and DuraPrep. After sterile draping and appropriate timeout procedure a transverse incision was created in the left side of the neck and carried down to the platysma. The plane between the sternocleidomastoid and strap muscles dissected bluntly until the prevertebral space was reached. The first identifiable disc space was noted to be C3-4 on a localizing radiograph. The dissection was then undertaken in the longus coli muscle to allow placement of a self-retaining Caspar type retractor.  The anterior longitudinal  ligament was opened at C3-4 and ventral osteophytes were removed with a Leksell rongeur and Kerrison punch. Interspace was cleared of significant quantity of the degenerated disc material in the region of the posterior longitudinal ligament was removed. Dissection was carried out using a high-speed drill and 3-0 Karlin curettes. Uncinate processes were drilled down and removed and osteophytes from the inferior margin of the body of C3 were removed with a Kerrison 2 mm gold punch. After the central canal and lateral recesses were well decompressed hemostasis was achieved with the bipolar cautery and some small pledgets of Gelfoam soaked in thrombin that were later irrigated away.  A 7 mm cortical ring allograft was then placed into the interspace. C5-6 Was decompressed and fused in a similar fashion. An 8 mm graft was required at the C5-C6 level  Next the retractor was removed and a 22 mm nuvasive plate was placed over the vertebral bodies and secured with 13 mm variable angle screws. A second plate measuring 24 mm in length was placed over C5-C6 and secured also with 13 mm screws. A final localizing radiograph identified the position of the surgical construct. Hemostasis was achieved in the soft tissues and then the platysma was closed with 3-0 Vicryl in an interrupted fashion and 3-0 Vicryl was used in the subcuticular tissue. Blood loss was estimated at 100 cc

## 2013-10-16 NOTE — Transfer of Care (Signed)
Immediate Anesthesia Transfer of Care Note  Patient: Angela Dudley  Procedure(s) Performed: Procedure(s) with comments: Cervical three-four, Cervical five-six Anterior cervical decompression/diskectomy/fusion (N/A) - Cervical three-four, Cervical five-six Anterior cervical decompression/diskectomy/fusion  Patient Location: PACU  Anesthesia Type:General  Level of Consciousness: awake, alert  and oriented  Airway & Oxygen Therapy: Patient connected to face mask oxygen  Post-op Assessment: Report given to PACU RN  Post vital signs: stable  Complications: No apparent anesthesia complications

## 2013-10-16 NOTE — Anesthesia Procedure Notes (Addendum)
Procedure Name: Intubation Date/Time: 10/16/2013 12:57 PM Performed by: Maeola Harman Pre-anesthesia Checklist: Patient identified, Emergency Drugs available, Suction available, Patient being monitored and Timeout performed Patient Re-evaluated:Patient Re-evaluated prior to inductionOxygen Delivery Method: Circle system utilized Preoxygenation: Pre-oxygenation with 100% oxygen Intubation Type: IV induction Ventilation: Mask ventilation without difficulty Laryngoscope Size: Mac and 3 Grade View: Grade I Tube type: Oral Tube size: 7.0 mm Number of attempts: 1 Airway Equipment and Method: Video-laryngoscopy,  Stylet and LTA kit utilized Placement Confirmation: ETT inserted through vocal cords under direct vision,  positive ETCO2 and breath sounds checked- equal and bilateral Secured at: 21 cm Tube secured with: Tape Dental Injury: Teeth and Oropharynx as per pre-operative assessment  Difficulty Due To: Difficulty was anticipated Comments: Pt with short chin, and reduced neck mobility, however easy glidescope intubation.  Easy atraumatic induction and intubation with MAC 3 blade on video laryngoscope.  LTA and small ETT used.  Waldron Session, CRNA Dr. Conrad West Haverstraw verified placement.

## 2013-10-16 NOTE — Progress Notes (Signed)
Per crna, discoloration inner canthus L eyes /along l nare is bruised/discolored d/t eye tape

## 2013-10-16 NOTE — H&P (Signed)
CHIEF COMPLAINT:                                          Numbness and pain in the hands, arms, shoulders, legs, and feet  HISTORY OF PRESENT ILLNESS:                     Angela Dudley is a 68 year old, left-handed individual who is retired. She notes that she has had a number of symptoms that have been bothering her for years. She has been seen in the past by me in June of 2005 for similar problems. More recently, she was evaluated by Dr. Valeria BatmanMatt Weingold and EMG nerve conduction studies demonstrate that she does not have significant peripheral neuropathy. Plain radiographs of the neck were performed, along with an MRI which demonstrates that she has central protrusion of the disc at C3-4 in addition to central degeneration and protrusion of the disc at C5-C6. She has modest biforaminal stenosis at C5-6. The foramina are patent at C3-C4. C4-C5 demonstrates modest spondylosis but no evidence of neural compromise.  The patient notes that she has been quite frustrated with the symptoms. She likes to maintain an active lifestyle. 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 Because of this, she desires to have something definitive done for this process.   DATA:                                                              Today in the office, to further her work-up, I obtained a lateral flexion and extension x-ray of the cervical spine This is combined with a singular AP view, which demonstrates that the coronal alignment of her neck is quite good. Lateral flexion and extension demonstrates minimal spondylolisthesis at the level of C3-C4 of about 2 mm. This does not change significantly between flexion and extension.  PAST MEDICAL HISTORY:    Current Medical Conditions: She has had problems with chronic fatigue syndrome, irritable bowel syndrome, sarcoid, and orthostatic  hypotension in the past. In addition, she notes that she has a history of hiatal hernia.     Medications and Allergies:  HER ALLERGIES INCLUDE AMOXICILLIN WHICH CAUSES A RASH. Her current medications include Armor Thyroid, potassium, and Soma compound.  PERSONAL HISTORY:                                      She does not smoke or use alcohol. Height and weight have been stable at 227 pounds and 5\' 2"  in height.  SYSTEMS REVIEW:                                          Notable for hearing loss, ear pain, balance disturbance, nasal congestion and drainage, chronic cough, back pain, arm pain, joint pain and swelling, abdominal discomfort, blurred vision, thyroid  disease, difficulty with urination, near incontinence, and immunologic disorders, as noted in her History of Present Illness.  PHYSICAL EXAMINATION:                        General - On physical examination, she stands straight and erect. Her gait is unimpeded.     Musculoskeletal - There is tenderness to palpation of the supraclavicular fossa. Range of motion of her neck reveals the she can turn 45 degrees to the left and to the right. She flexes and extends about 60% of normal. Axial compression does reproduce tenderness in the posterior aspect of the neck, along with a Lhermitte's-type phenomenon. Motor strength is good in the deltoids, biceps, triceps, grips, and intrinsics to confrontation.    Deep tendon reflexes -  Deep tendon reflexes are 1+ in the biceps, absent in the triceps, absent in the brachioradialis.     Sensory Examination - Sensation appears intact distally to pin and vibration, both sides. Lower extremity strength and reflexes are within limits of normal.   IMPRESSION/PLAN:                                         The patient has some chronic spondylosis in the cervical spine, most notably at C3-4 and again at C5-C6. I note that, on the basis of the plain x-rays, there is not much instability in her cervical spine. These  processes have been going on for some time. I had evaluated the patient 10 years ago. She is still having a considerable constellation of symptoms, which she feels are getting worse. I have suggested that further work-up at this time would include a CT scan of the cervical spine to look at bone quality and actually look at the size of the foramen from a bony perspective. If indeed, the foraminal stenosis is substantial at C5-C6, she may benefit from surgical decompression here. Also, the degree of compromise of her spinal canal C3-4 would be better defined on the CT scan.   She had a CT scan of her cervical spine.  Angela Dudley has degenerative changes with a moderate size bone spurs at C3-4 and at C5-6.  There is significantly degenerated disc at each of these levels.  I noted on her previous MRI that she has effacement of the cord at C3-4 and biforaminal stenosis at C5-6 and this is again recapitulated on the basis of the CT scan.  The official reports suggest that she has just mild changes of spondylosis, but the MRI confirms the fact that she has a cord effacement.  I demonstrated the [facility] to [facility] and I note that the biggest problems are these two joints.  She has numbness that is radiating into both arms and she has chronic headaches which she feels are attributable to substantial difficulties with her neck.  Otherwise, she has been getting some symptoms of perioral numbness also which I noted to her I do not feel that are related at all to the cervical spine.  The degree to which Angela Dudley would get relief may be somewhat limited given the findings on her MRI and her cervical spine, I suggested if we could at all treat conservatively it would be advisable, but Angela Dudley notes that she has been having these symptoms for considerable length of time and feels something more definitive needs to be done.  I advised that we  can do a two-level anterior decompression and arthrodesis at C3-4 and C5-6.  This would require an  operation on the front of the neck on the left side where we would remove the discs, place a bone graft into that space between 3-4 and 5-6 vertebrae and then placed two small Titanium plates over them to allow those bone grafts to fuse in and heal as one solid bone.  This should allow her some relief of the symptoms in her arms, hopefully allow her to function better and perhaps even give her some relief of the headaches.  She is well motivated to get something definitive done and she notes the symptoms have been overwhelming.  She would like to continue to ride a tractor, take care of her yard work as she has been doing with increasing difficulty.  We will plan on scheduling surgery at her earliest convenience.  I do note on her exam that her motor strength is good and intact in the upper extremities particularly.  Her gait is stable, most likely wide-based.

## 2013-10-16 NOTE — Anesthesia Preprocedure Evaluation (Addendum)
Anesthesia Evaluation  Patient identified by MRN, date of birth, ID band Patient awake    Reviewed: Allergy & Precautions, H&P , NPO status , Patient's Chart, lab work & pertinent test results, reviewed documented beta blocker date and time   Airway Mallampati: I TM Distance: >3 FB Neck ROM: Limited    Dental  (+) Teeth Intact, Dental Advisory Given   Pulmonary shortness of breath and with exertion,  breath sounds clear to auscultation        Cardiovascular negative cardio ROS  Rhythm:Regular Rate:Normal     Neuro/Psych  Headaches, Anxiety  Neuromuscular disease    GI/Hepatic Neg liver ROS, hiatal hernia, PUD, GERD-  Controlled,  Endo/Other  Hypothyroidism   Renal/GU negative Renal ROS     Musculoskeletal negative musculoskeletal ROS (+) Arthritis -, Fibromyalgia -  Abdominal (+)  Abdomen: soft. Bowel sounds: normal.  Peds  Hematology negative hematology ROS (+)   Anesthesia Other Findings   Reproductive/Obstetrics                        Anesthesia Physical Anesthesia Plan  ASA: III  Anesthesia Plan: General   Post-op Pain Management:    Induction: Intravenous  Airway Management Planned: Oral ETT and Video Laryngoscope Planned  Additional Equipment:   Intra-op Plan:   Post-operative Plan: Extubation in OR  Informed Consent: I have reviewed the patients History and Physical, chart, labs and discussed the procedure including the risks, benefits and alternatives for the proposed anesthesia with the patient or authorized representative who has indicated his/her understanding and acceptance.     Plan Discussed with: CRNA and Surgeon  Anesthesia Plan Comments:         Anesthesia Quick Evaluation

## 2013-10-16 NOTE — Progress Notes (Signed)
Patient ID: [name], female   DOB: 03/04/1945, 68 y.o.   MRN: [id number] Vital signs are stable. Patient is awake and alert. Motor function appears intact. Dressing is dry.

## 2013-10-16 NOTE — Anesthesia Postprocedure Evaluation (Signed)
Anesthesia Post Note  Patient: Angela Dudley  Procedure(s) Performed: Procedure(s) (LRB): Cervical three-four, Cervical five-six Anterior cervical decompression/diskectomy/fusion (N/A)  Anesthesia type: general  Patient location: PACU  Post pain: Pain level controlled  Post assessment: Patient's Cardiovascular Status Stable  Last Vitals:  Filed Vitals:   10/16/13 1530  BP: 148/65  Pulse: 57  Temp:   Resp: 16    Post vital signs: Reviewed and stable  Level of consciousness: sedated  Complications: No apparent anesthesia complications

## 2013-10-17 DIAGNOSIS — M4722 Other spondylosis with radiculopathy, cervical region: Secondary | ICD-10-CM | POA: Diagnosis not present

## 2013-10-17 NOTE — Discharge Summary (Signed)
Physician Discharge Summary  Patient ID: Angela MealyMary B Dudley MRN: [id number] DOB/AGE: 66/04/1945 66 y.o.  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 She bruisibility and there was evidence of some ecchymoses about the left eye secondary to eye tape injury and on the anterior border of the chest wall.  Consults: None  Significant Diagnostic Studies: None  Treatments: surgery: Anterior cervical decompression C3-4 and C5-C6 arthrodesis with structural allograft and nuvasive anterior plate fixation W0-9C3-4 C5-6  Discharge Exam: Blood pressure 140/68, pulse 94, temperature 98.3 F (36.8 C), temperature source Oral, resp. rate 20, weight 105.235 kg (232 lb), SpO2 95.00%. Incision is clean and dry with ecchymoses about it above and below motor function is intact in the deltoids biceps triceps grips and intrinsics. Swallowing function is intact patient is ambulatory. She is voiding well.  Disposition:  discharge home  Discharge Instructions   Call MD for:  redness, tenderness, or signs of infection (pain, swelling, redness, odor or green/yellow discharge around incision site)    Complete by:  As directed      Call MD for:  severe uncontrolled pain    Complete by:  As directed      Call MD for:  temperature >100.4    Complete by:  As directed      Diet - low sodium heart healthy    Complete by:  As directed      Discharge instructions    Complete by:  As directed   Okay to shower. Do not apply salves or appointments to incision. No heavy lifting with the upper extremities greater than 15 pounds. May resume driving when not requiring pain medication  and patient feels comfortable with doing so.     Increase activity slowly    Complete by:  As directed             Medication List         B-complex with vitamin C tablet  Take 1 tablet by mouth daily.     Biotin 5000 MCG Caps  Take 1 capsule by mouth daily.     calcium carbonate 1250 MG tablet  Commonly known as:  OS-CAL - dosed in mg of elemental calcium  Take 1 tablet by mouth daily with breakfast.     carisoprodol 350 MG tablet  Commonly known as:  SOMA  Take 350 mg by mouth 2 (two) times daily as needed for muscle spasms.     CO Q 10 PO  Take 1 capsule by mouth daily.     fish oil-omega-3 fatty acids 1000 MG capsule  Take 3 g by mouth daily.     fluorometholone 0.1 % ophthalmic suspension  Commonly known as:  FML  Place 1 drop into both eyes 2 (two) times daily as needed. Allergies in eyes.     GNP POTASSIUM GLUCONATE PO  Take 1 capsule by mouth daily.     ipratropium 0.03 % nasal spray  Commonly known as:  ATROVENT  Place 2 sprays into both nostrils every 12 (twelve) hours.     Lecithin 1200 MG Caps  Take 2 capsules by mouth every 4 (four) hours as needed. Uses to help with pain.     magnesium gluconate 500 MG tablet  Commonly known as:  MAGONATE  Take 500 mg by mouth daily.     potassium chloride SA 20 MEQ tablet  Commonly known as:  K-DUR,KLOR-CON  Take 1 tablet (20 mEq total) by mouth daily.     Selenium 200 MCG Caps  Take 1 capsule by mouth daily.     thyroid 60 MG tablet  Commonly known as:  ARMOUR  Take 60 mg by mouth daily before breakfast.     vitamin E 400 UNIT capsule  Take 400 Units by mouth daily.     VSL#3 Caps  Take 1 capsule by mouth daily.     zinc gluconate 50 MG tablet  Take 50 mg by mouth daily.         SignedStefani Dama: Skylyn Slezak J 10/17/2013, 11:20 AM

## 2013-10-17 NOTE — Evaluation (Signed)
Physical Therapy Evaluation Patient Details Name: Angela Dudley MRN: [id number] DOB: 06/04/1945 Today's Date: 10/17/2013   History of Present Illness  68 y.o. s/p Cervical three-four, Cervical five-six Anterior cervical decompression/diskectomy/fusion.  Clinical Impression  Patient presents s/p above surgery. Pt with mild balance deficits noted during mobility. Fatigues easily during gait requiring frequent rest breaks. Pt not interested in follow up therapy. Pt refused to negotiate full flight of steps reporting, "I can do mine at home, I have done them thousands of times." Reports falls at home. Pt would benefit from HHPT to improve safe mobility however pt refusing at this time. Will continue to follow while in hospital to maximize independence.    Follow Up Recommendations Supervision for mobility/OOB    Equipment Recommendations  None recommended by PT    Recommendations for Other Services       Precautions / Restrictions Precautions Precautions: Cervical Precaution Comments: Educated on precautions however after discussion with RN and patient, MD states pt does not have any cervical precautions except no driving for 1 week. Restrictions Weight Bearing Restrictions: No      Mobility  Bed Mobility               General bed mobility comments: Received sitting EOB upon PT arrival. Pt upset about OT telling her how to transfer when she wanted to do it her way.  Transfers Overall transfer level: Needs assistance Equipment used: None Transfers: Sit to/from Stand Sit to Stand: Supervision         General transfer comment: Ambulated to bathroom and transferred on/off toilet Mod I. Stood from EOB x1.   Ambulation/Gait Ambulation/Gait assistance: Supervision Ambulation Distance (Feet): 200 Feet Assistive device: None (hand rail) Gait Pattern/deviations: Wide base of support;Step-through pattern;Decreased stride length Gait velocity: decreased   General Gait  Details: "waddling like" gait pattern due to body habitus. Unsteadiness noted during gait training- pt reported as baseline. A few short standing rest breaks. Dyspnea noted.   Stairs Stairs: Yes Stairs assistance: Min guard Stair Management: Two rails;Step to pattern Number of Stairs: 7 General stair comments: Pt refused to negotiate full flight of steps, "I know I can do it at home, these steps scare me, I am going to trip on my gown," as therapist is holding gown up and out of the way.  Wheelchair Mobility    Modified Rankin (Stroke Patients Only)       Balance Overall balance assessment: Needs assistance   Sitting balance-Leahy Scale: Fair       Standing balance-Leahy Scale: Fair Standing balance comment: Min guard assist for safety during gait due to balance deficits.                              Pertinent Vitals/Pain Pain Assessment: 0-10 Pain Score:  (not rated on pain scale.) Pain Location: headache Pain Descriptors / Indicators: Aching Pain Intervention(s): Monitored during session;Limited activity within patient's tolerance;Repositioned    Home Living Family/patient expects to be discharged to:: Private residence Living Arrangements: Alone Available Help at Discharge: Other (Comment) Type of Home: House Home Access: Stairs to enter Entrance Stairs-Rails: Left Entrance Stairs-Number of Steps: 10 Home Layout: Two level;Able to live on main level with bedroom/bathroom Home Equipment: Gilmer MorCane - single point;Shower seat - built in      Prior Function Level of Independence: Independent with assistive device(s)         Comments: used cane at times  Hand Dominance        Extremity/Trunk Assessment   Upper Extremity Assessment: Defer to OT evaluation           Lower Extremity Assessment: Overall WFL for tasks assessed         Communication   Communication: No difficulties  Cognition Arousal/Alertness: Awake/alert Behavior During  Therapy: WFL for tasks assessed/performed Overall Cognitive Status: Within Functional Limits for tasks assessed                      General Comments General comments (skin integrity, edema, etc.): Ecchymoses present anterior neck and under left orbit.    Exercises        Assessment/Plan    PT Assessment Patient needs continued PT services  PT Diagnosis Acute pain;Difficulty walking   PT Problem List Pain;Cardiopulmonary status limiting activity;Decreased activity tolerance;Decreased balance;Decreased safety awareness;Decreased mobility  PT Treatment Interventions Balance training;Gait training;Stair training;Patient/family education;Functional mobility training;Therapeutic activities   PT Goals (Current goals can be found in the Care Plan section) Acute Rehab PT Goals Patient Stated Goal: to go home PT Goal Formulation: With patient Time For Goal Achievement: 10/31/13 Potential to Achieve Goals: Good    Frequency Min 5X/week   Barriers to discharge Decreased caregiver support Pt lives home alone.    Co-evaluation               End of Session   Activity Tolerance: Patient tolerated treatment well Patient left: in bed;with call bell/phone within reach (sitting EOB.) Nurse Communication: Mobility status;Precautions    Functional Assessment Tool Used: Clinical judgment Functional Limitation: Mobility: Walking and moving around Mobility: Walking and Moving Around Current Status (Z6109(G8978): At least 1 percent but less than 20 percent impaired, limited or restricted Mobility: Walking and Moving Around Goal Status [phone](G8979): At least 1 percent but less than 20 percent impaired, limited or restricted    Time: 1434-1457 PT Time Calculation (min): 23 min   Charges:   PT Evaluation $Initial PT Evaluation Tier I: 1 Procedure PT Treatments $Gait Training: 8-22 mins   PT G Codes:   Functional Assessment Tool Used: Clinical judgment Functional Limitation: Mobility:  Walking and moving around    [place]olan, [place] A 10/17/2013, 3:43 PM Alvie HeidelbergShauna Folan, PT, DPT [phone]

## 2013-10-17 NOTE — Evaluation (Addendum)
Occupational Therapy Evaluation Patient Details Name: Angela Dudley MRN: [id number] DOB: 12/08/1945 Today's Date: 10/17/2013    History of Present Illness 68 y.o. s/p Cervical three-four, Cervical five-six Anterior cervical decompression/diskectomy/fusion.   Clinical Impression   Pt s/p above. Pt independent with ADLs, PTA. Education provided during session.    Follow Up Recommendations  No OT follow up;Supervision - Intermittent    Equipment Recommendations  Other (comment) (reacher)    Recommendations for Other Services       Precautions / Restrictions Precautions Precautions: Cervical Precaution Comments: educated on precautions; handout given Restrictions Weight Bearing Restrictions: No      Mobility Bed Mobility Overal bed mobility: Needs Assistance Bed Mobility: Rolling;Sidelying to Sit;Sit to Sidelying Rolling: Supervision/Min guard Sidelying to sit: Min guard     Sit to sidelying: Min guard General bed mobility comments: several cues  given for technique. Pt wanting to do it her way. Used rails-pt states she has something to hold onto at home.  Transfers Overall transfer level: Needs assistance   Transfers: Sit to/from Stand Sit to Stand: Supervision              Balance                                            ADL Overall ADL's : Needs assistance/impaired     Grooming: Brushing hair;Supervision/safety;Standing           Upper Body Dressing : Set up;Supervision/safety;Sitting   Lower Body Dressing: Supervision/safety;Set up;Sit to/from stand   Toilet Transfer: Supervision/safety;Ambulation;Regular Toilet;Grab bars             General ADL Comments: Problem solved how to donn bra and also feeding pets while trying to maintain precautions. Educated on use of reacher. Educated on use of cup/straw to help maintain precautions. Recommended sitting for LB ADLs.  Discussed safety.  Discussed UB clothing that would help  maintain precautions. Discussed avoiding hot surfaces/sharp objects due to decreased sensation in left hand.      Vision                     Perception     Praxis      Pertinent Vitals/Pain Pain Assessment: Faces Faces Pain Scale: Hurts little more Pain Location: headache Pain Descriptors / Indicators: Aching Pain Intervention(s): Monitored during session;Repositioned     Hand Dominance     Extremity/Trunk Assessment Upper Extremity Assessment Upper Extremity Assessment: Left Upper Extremity: decreased sensation   Lower Extremity Assessment Lower Extremity Assessment: Overall WFL for tasks assessed       Communication Communication Communication: No difficulties   Cognition Arousal/Alertness: Awake/alert Behavior During Therapy: WFL for tasks assessed/performed Overall Cognitive Status: Within Functional Limits for tasks assessed                     General Comments       Exercises       Shoulder Instructions      Home Living Family/patient expects to be discharged to:: Private residence Living Arrangements: Alone Available Help at Discharge: Other (Comment) (may try to work out someone checking on her) Type of Home: House Home Access: Stairs to enter Entergy CorporationEntrance Stairs-Number of Steps: 10 Entrance Stairs-Rails: Left Home Layout: Two level Alternate Level Stairs-Number of Steps: 10 Alternate Level Stairs-Rails: Right;Left Bathroom Shower/Tub: Producer, television/film/videoWalk-in shower   Bathroom Toilet:  Handicapped height     Home Equipment: Cane - single point;Shower seat - built in          Prior Functioning/Environment Level of Independence: Independent with assistive device(s)        Comments: used cane at times    OT Diagnosis: Acute pain   OT Problem List: Decreased activity tolerance;Decreased knowledge of use of DME or AE;Decreased knowledge of precautions;Pain   OT Treatment/Interventions: Self-care/ADL training;DME and/or AE  instruction;Therapeutic activities;Patient/family education;Balance training    OT Goals(Current goals can be found in the care plan section) ADL Goals Pt Will Perform Upper Body Dressing: with modified independence;sitting Pt Will Perform Lower Body Dressing: with modified independence;sit to/from stand Additional ADL Goal #1: Pt will independently perform bed mobility at Mod I level as precursor ADLs.  OT Frequency: Min 2X/week   Barriers to D/C:            Co-evaluation              End of Session Equipment Utilized During Treatment: Gait belt Nurse Communication: Mobility status  Activity Tolerance: Patient tolerated treatment well Patient left: in bed;with call bell/phone within reach   Time: 0847-0921 OT Time Calculation (min): 34 min Charges:  OT General Charges $OT Visit: 1 Procedure OT Evaluation $Initial OT Evaluation Tier I: 1 Procedure OT Treatments $Self Care/Home Management : 8-22 mins G-Codes: OT G-codes **NOT FOR INPATIENT CLASS** Functional Assessment Tool Used: clinical judgment Functional Limitation: Self care Self Care Current Status (G9562(G8987): At least 1 percent but less than 20 percent impaired, limited or restricted Self Care Goal Status (Z3086(G8988): 0 percent impaired, limited or restricted  Earlie RavelingStraub, Kayron Hicklin L OTR/L [phone] 10/17/2013, 10:21 AM

## 2013-10-17 NOTE — Progress Notes (Addendum)
Patient alert and oriented, mae's well, voiding adequate amount of urine, swallowing without difficulty with slight swelling noted at incision area; MD aware, no c/o pain. RN informed patient to call MD office if swelling increases. Patient discharged home with friends. Script and discharged instructions given to patient. Patient and friends stated understanding of d/c instructions given and has an appointment with MD. Marin RobertsAisha Burlon Centrella RN

## 2013-10-17 NOTE — Progress Notes (Signed)
PT Cancellation Note  Patient Details Name: Sandi MealyMary B Mcnaught MRN: [id number] DOB: 11/08/1945   Cancelled Treatment:    Reason Eval/Treat Not Completed: Patient declined, no reason specified Pt just finished with OT prior to PT arrival. Pt reports intense headache and politely declined participating in therapy in AM. Will follow up in PM or when pt willing to participate.   Alvie HeidelbergFolan, Kimbree Casanas A 10/17/2013, 11:39 AM Alvie HeidelbergShauna Folan, PT, DPT [phone]

## 2013-10-18 ENCOUNTER — Encounter (HOSPITAL_COMMUNITY): Payer: Self-pay | Admitting: Neurological Surgery

## 2014-07-05 ENCOUNTER — Other Ambulatory Visit: Payer: Self-pay | Admitting: Neurological Surgery

## 2014-07-17 ENCOUNTER — Encounter (HOSPITAL_COMMUNITY): Payer: Self-pay

## 2014-07-17 ENCOUNTER — Other Ambulatory Visit: Payer: Self-pay

## 2014-07-17 ENCOUNTER — Encounter (HOSPITAL_COMMUNITY)
Admission: RE | Admit: 2014-07-17 | Discharge: 2014-07-17 | Disposition: A | Discharge status: Home / Self Care | Payer: Medicare Other | Source: Ambulatory Visit | Attending: Neurological Surgery | Admitting: Neurological Surgery

## 2014-07-17 DIAGNOSIS — K219 Gastro-esophageal reflux disease without esophagitis: Secondary | ICD-10-CM | POA: Diagnosis not present

## 2014-07-17 DIAGNOSIS — D869 Sarcoidosis, unspecified: Secondary | ICD-10-CM | POA: Diagnosis not present

## 2014-07-17 DIAGNOSIS — Z79899 Other long term (current) drug therapy: Secondary | ICD-10-CM | POA: Insufficient documentation

## 2014-07-17 DIAGNOSIS — Z01810 Encounter for preprocedural cardiovascular examination: Secondary | ICD-10-CM | POA: Diagnosis not present

## 2014-07-17 DIAGNOSIS — Z01812 Encounter for preprocedural laboratory examination: Secondary | ICD-10-CM | POA: Diagnosis not present

## 2014-07-17 DIAGNOSIS — Z01818 Encounter for other preprocedural examination: Secondary | ICD-10-CM | POA: Insufficient documentation

## 2014-07-17 DIAGNOSIS — E785 Hyperlipidemia, unspecified: Secondary | ICD-10-CM | POA: Insufficient documentation

## 2014-07-17 DIAGNOSIS — E039 Hypothyroidism, unspecified: Secondary | ICD-10-CM | POA: Insufficient documentation

## 2014-07-17 DIAGNOSIS — M5000 Cervical disc disorder with myelopathy, unspecified cervical region: Secondary | ICD-10-CM | POA: Insufficient documentation

## 2014-07-17 LAB — BASIC METABOLIC PANEL
Anion gap: 7 (ref 5–15)
BUN: 16 mg/dL (ref 6–20)
CO2: 28 mmol/L (ref 22–32)
Calcium: 9.5 mg/dL (ref 8.9–10.3)
Chloride: 104 mmol/L (ref 101–111)
Creatinine, Ser: 0.81 mg/dL (ref 0.44–1.00)
GFR calc Af Amer: >60 mL/min (ref >60)
GFR calc non Af Amer: >60 mL/min (ref >60)
GLUCOSE: 116 mg/dL — AB (ref 65–99)
Potassium: 4.2 mmol/L (ref 3.5–5.1)
Sodium: 139 mmol/L (ref 135–145)

## 2014-07-17 LAB — CBC
HCT: 46 % (ref 36.0–46.0)
HEMOGLOBIN: 15.3 g/dL — AB (ref 12.0–15.0)
MCH: 30.4 pg (ref 26.0–34.0)
MCHC: 33.3 g/dL (ref 30.0–36.0)
MCV: 91.3 fL (ref 78.0–100.0)
Platelets: 240 10*3/uL (ref 150–400)
RBC: 5.04 MIL/uL (ref 3.87–5.11)
RDW: 14.4 % (ref 11.5–15.5)
WBC: 6.9 10*3/uL (ref 4.0–10.5)

## 2014-07-17 LAB — SURGICAL PCR SCREEN
MRSA, PCR: NEGATIVE
STAPHYLOCOCCUS AUREUS: NEGATIVE

## 2014-07-17 NOTE — Pre-Procedure Instructions (Signed)
    Angela Dudley  07/17/2014      [facility] [place] [address] [place] [address] Phone: [phone] Fax: [phone]  [facility] [place] - [address] [place] [address] Phone: [phone] Fax: [phone]    Your procedure is scheduled on Monday, July 18  [facility] Main Entrance "A" at 10:45  A.M.  Call this number if you have problems the morning of surgery:  [phone]               Any questions call [phone] Monday-Friday 8AM-4PM   Remember:  Do not eat food or drink liquids after midnight.  Take these medicines the morning of surgery with A SIP OF WATER: zyrtec,  atrovent  (bring to hospital), thyroid (armour)               Stop fish oil  And any nonsteroidal anti-inflammatory drugs: advil, motrin, ibuprofen  Do not wear jewelry, make-up or nail polish.  Do not wear lotions, powders, or perfumes.  You may wear deodorant.  Do not shave 48 hours prior to surgery.  Men may shave face and neck.  Do not bring valuables to the hospital.  [facility] is not responsible for any belongings or valuables.  Contacts, dentures or bridgework may not be worn into surgery.  Leave your suitcase in the car.  After surgery it may be brought to your room.  For patients admitted to the hospital, discharge time will be determined by your treatment team.  Patients discharged the day of surgery will not be allowed to drive home.   Name and phone number of your driver:    Special instructions:  Review preparing for surgery handout  Please read over the following fact sheets that you were given. Pain Booklet, Coughing and Deep Breathing, MRSA Information and Surgical Site Infection Prevention

## 2014-07-17 NOTE — Progress Notes (Addendum)
Anesthesia Consult:  Pt is 69 year old female scheduled for C4-5 ACDF on 07/23/2014 with Dr. Danielle Dudley.   PCP is Dr. Martin MajesticSarah Dudley- pt has only seen her once in last 1-1.5 years for wellness physical.   PMH includes: hypothyroidism, sarcoidosis, hyperlipidemia, GERD. S/p biopsy of benign thyroid nodule earlier this year. Never smoker. BMI 42. S/p C3-4, C5-6 ACDF 10/16/13.  Medications include: armour thyroid.   Called to see pt at PAT. Pt reports symptoms of dizziness, her "head feels weird" and feeling unsteady since April when disc in neck herniated. Pt also reports 2 episodes where she was at rest when she felt dizzy and "my vision went black and I thought I was going to pass out but I didn't"; these episodes occurred 3-5 weeks ago, none since.  Pt reports the 2 episodes where "my vision went black" are new, but other symptoms of dizziness, head feeling weird and unsteadiness happened with frequency prior to her neck surgery to repair herniated discs back in 10/2013. After her surgery in October, symptoms completely resolved until another disc herniated in April, then symptoms returned.   Pt denies CP, SOB, peripheral edema. Saw cardiology 05/2013 (see care everywhere) for eval of chest pain; cardiologist recommended stress test. Pt reports she declined stress test because "I've had 2 negative stress tests before" and pt felt she didn't need another one. Has not been back to cardiology since then.   Of note, pt's pulse was 47 on arrival to PAT.  Orthostatic VS:  lying 146/69, pulse 85 Sitting 136/70, pulse 89 Standing 137/74, pulse 110  CV RRR. Lungs CTAB. DP pulses equal, 2+, no peripheral edema. No carotid bruits.   Anesthesia history includes: pt reports cough after intubation that can last months and challenging intubation. [facility]est[facility] available in Epic. Notes indicate "Pt with short chin, and reduced neck mobility, however easy glidescope intubation. Easy  atraumatic induction and intubation with MAC 3 blade on video laryngoscope. LTA and small ETT used."  Preoperative labs reviewed.    EKG 07/17/2014: NSR (84 bpm).   Echo 10/29/2010 (see correspondence dated 10/18/2013 in media tab):  -LV grossly normal in size, mild concentric LVH, LV wall motion normal -LV apex not well visualized -mild grade I diastolic dysfunction; abnormal relaxation pattern of mitral inflow -mild aortic regurgitation (1+)  Nuclear stress test 10/23/2008 (see correspondence dated 10/18/2013 in media tab):  -post stress myocardial perfusion images show a normal pattern of perfusion in all regions. Post-stress LV normal in size. No scintigraphic evidence of inducible myocardial ischemia.  -No regional wall motion abnormalities. Post-stress EF is 75% -Exercise capacity 7.0 METS. Exercise capacity normal. Normal max effort GXT.  -Normal myocardial perfusion study.   Cardiac event monitor 09/18/1999 (see correspondence dated 10/18/2013 in media tab): -Multiple episodes of unsteadiness and possible vertigo -no evidence to suggest cardiac dysrhythmia is the etiology.   Discussed case, pt symptoms with Dr. Noreene LarssonJoslin. Pt will need medical eval by PCP for symptoms prior to surgery. Spoke with Angela Dudley in Dr. Verlee RossettiElsner's office. She was able to get pt appt with Dr. Kristen Dudley 07/18/14.   Angela Mastngela Dudley, Angela Dudley [facility]/Anesthesiology Phone: [phone] 07/18/2014 4:32 PM  Addendum: Signed letter for surgical clearance dated 07/19/14 from Dr. Ninetta LightsSara Dudley received.  It stated, "Ms. Angela Jordanlliot does have several medical problems. Her EKG & labs are OK for surgery. ([facility]) Her age & morbid obesity certainly increase her risks of complications. However, other  than these, I cannot find any other medical problems that would preclude this surgery."  Velna Ochs [facility] Short Stay Center/Anesthesiology Phone [phone] 07/19/2014 5:48  PM

## 2014-07-17 NOTE — Progress Notes (Addendum)
PCP:[facility]  Pt. Refuses to sign release for records from Dr. Sherren KernsMark Mitchell and [facility]. States she doen't feel that her records will be kept secure. I explained the process and she still insist not to sign release.  Pt. Wants to talk to Dr. Danielle DessElsner prior to signing consent.  Pt. Also stated when sitting in a chair she "was seeing black,thought I was going to pass out. Stated this happened 2x in last 4 weeks. Stated she left message at Dr. Verlee RossettiElsner's office and they havent responded. She also hasn't notified Dr. Ernestina PennaPurr.  Last chest pain 04/2013. [facility],said they did ekg and no problems.  Notified Dahlia ClientHannah, Child psychotherapistsocial worker at ED to come see pt.   Orthostatic blood pressures: Lying: 146/69   Heart rate 85  Sitting: 136/70  Heart rate 89 Standing: 137/74  Heart rate 110

## 2014-07-18 NOTE — Clinical Social Work Note (Signed)
Clinical Social Work Assessment  Patient Details  Name: Angela Dudley MRN: [id number] Date of Birth: March 07, 1945  Date of referral:  07/18/14               Reason for consult:  Mental Health Concerns, Rule-out Psychosocial                Permission sought to share information with:  Other (none at this time) Permission granted to share information::  No  Name::        Agency::     Relationship::     Contact Information:     Housing/Transportation Living arrangements for the past 2 months:  Single Family Home Source of Information:  Patient, Medical Team, Other (Comment Required) (Chart Review) Patient Interpreter Needed:  None Criminal Activity/Legal Involvement Pertinent to Current Situation/Hospitalization:  No - Comment as needed Significant Relationships:  Pets, Friend Lives with:  Self, Pets Do you feel safe going back to the place where you live?  Yes Need for family participation in patient care:  No (Coment)  Care giving concerns:  None identified at this time per patient.  Patient currently reports living in [place] in the oldest home built per report in 61.  Patient lives alone with her 2 dogs whom she reports she takes care of.  No family reported, however patient has strong community support and friends.   Social Worker assessment / plan:  Consult placed from preadmission RN who reports during psychosocial questioning, patient reported "homocidal gestures".  RN felt patient needed social work consult to rule out any mental health concerns/inpatient admission.  LCSW met with patient who was irritated and upset that SW was called for a comment that many people make when they are upset.  Patient reports "my neighbors are rude, cut my trees down, park in my yard, and her driveway".  Patient reports "I just want to kill them", however not literally per report. Patient reports she has no access to guns, weapons, or intent. Patient reports she takes pride in her home and yard  and has a botanical garden in which she has worked hard to create and maintain.  Patient reports she is not going to hurt her neighbors or has any plans, she is just very upset they cut her maple and pecan trees.  Patient is alert and oriented, able to accept SW services, and becomes understanding with reason for consult.  Patient is not internally preoccupied, nor reporting SI/HI/AVH.  LCSW has no concerns at this time regarding patient's behavior or danger to self or others. Patient discharges from pre-admission testing and will transport back home via PV. Patient denied needs for services or community programs.  Patient appreciative of time and assessment at the end of assessment by SW.    Employment status:  Retired Insurance underwriter information:  Medicare, Managed Care PT Recommendations:  Not assessed at this time Information / Referral to community resources:  Other (Comment Required) (None per patient needed at this time)  Patient/Family's Response to care:  Agreeable to assessment and plan.    Patient/Family's Understanding of and Emotional Response to Diagnosis, Current Treatment, and Prognosis:  Patient verbalizes understanding and process/reasoning for SW consult however is very irritated and frustrated that RN could not ask appropriate questions as SW completed to bypass the SW assessment. Patient was able to come around and complete assessment AEB engaging in conversation regarding her HI gestures, and discussing positive aspects of life such as her garden, friends in the community, and  strengths that patient upholds.   Emotional Assessment Appearance:  Appears stated age Attitude/Demeanor/Rapport:  Angry Affect (typically observed):  Frustrated Orientation:  Oriented to Self, Oriented to Place, Oriented to  Time, Oriented to Situation Alcohol / Substance use:  Not Applicable Psych involvement (Current and /or in the community):  No (Comment) (No previous psych history nor outpatient  currently)  Discharge Needs  Concerns to be addressed:  Mental Health Concerns (Per RN request) Readmission within the last 30 days:  No Current discharge risk:  None Barriers to Discharge:  Barriers Resolved (Patient discharged home via private vehicle)   [place], [place] 07/18/2014, 9:41 AM

## 2014-07-22 MED ORDER — SODIUM CHLORIDE 0.9 % IV SOLN
1500.0000 mg | INTRAVENOUS | Status: AC
  Administered 2014-07-23: 1500 mg via INTRAVENOUS
  Filled 2014-07-22 (×2): qty 1500

## 2014-07-23 ENCOUNTER — Inpatient Hospital Stay (HOSPITAL_COMMUNITY): Payer: Medicare Other | Admitting: Certified Registered Nurse Anesthetist

## 2014-07-23 ENCOUNTER — Encounter (HOSPITAL_COMMUNITY)
Admission: RE | Disposition: A | Discharge status: Home / Self Care | Payer: Self-pay | Source: Ambulatory Visit | Attending: Neurological Surgery

## 2014-07-23 ENCOUNTER — Encounter (HOSPITAL_COMMUNITY): Payer: Self-pay | Admitting: *Deleted

## 2014-07-23 ENCOUNTER — Inpatient Hospital Stay (HOSPITAL_COMMUNITY): Payer: Medicare Other | Admitting: Emergency Medicine

## 2014-07-23 ENCOUNTER — Observation Stay (HOSPITAL_COMMUNITY)
Admission: RE | Admit: 2014-07-23 | Discharge: 2014-07-24 | Disposition: A | Discharge status: Home / Self Care | Payer: Medicare Other | Source: Ambulatory Visit | Attending: Neurological Surgery | Admitting: Neurological Surgery

## 2014-07-23 ENCOUNTER — Inpatient Hospital Stay (HOSPITAL_COMMUNITY): Payer: Medicare Other

## 2014-07-23 DIAGNOSIS — M797 Fibromyalgia: Secondary | ICD-10-CM | POA: Insufficient documentation

## 2014-07-23 DIAGNOSIS — E039 Hypothyroidism, unspecified: Secondary | ICD-10-CM | POA: Insufficient documentation

## 2014-07-23 DIAGNOSIS — E785 Hyperlipidemia, unspecified: Secondary | ICD-10-CM | POA: Insufficient documentation

## 2014-07-23 DIAGNOSIS — Z981 Arthrodesis status: Secondary | ICD-10-CM | POA: Insufficient documentation

## 2014-07-23 DIAGNOSIS — M1990 Unspecified osteoarthritis, unspecified site: Secondary | ICD-10-CM | POA: Insufficient documentation

## 2014-07-23 DIAGNOSIS — R0602 Shortness of breath: Secondary | ICD-10-CM | POA: Insufficient documentation

## 2014-07-23 DIAGNOSIS — M47812 Spondylosis without myelopathy or radiculopathy, cervical region: Secondary | ICD-10-CM | POA: Diagnosis present

## 2014-07-23 DIAGNOSIS — Z419 Encounter for procedure for purposes other than remedying health state, unspecified: Secondary | ICD-10-CM

## 2014-07-23 HISTORY — PX: ANTERIOR CERVICAL DECOMP/DISCECTOMY FUSION: SHX1161

## 2014-07-23 LAB — GLUCOSE, CAPILLARY: Glucose-Capillary: 131 mg/dL — ABNORMAL HIGH (ref 65–99)

## 2014-07-23 SURGERY — ANTERIOR CERVICAL DECOMPRESSION/DISCECTOMY FUSION 1 LEVEL
Anesthesia: General | Site: Neck

## 2014-07-23 MED ORDER — MORPHINE SULFATE 2 MG/ML IJ SOLN: 1.0000 mg | INTRAMUSCULAR | Status: DC | PRN

## 2014-07-23 MED ORDER — POLYETHYLENE GLYCOL 3350 17 G PO PACK: 17.0000 g | PACK | Freq: Every day | ORAL | Status: DC | PRN

## 2014-07-23 MED ORDER — SODIUM CHLORIDE 0.9 % IR SOLN
Status: DC | PRN
  Administered 2014-07-23: 16:00:00

## 2014-07-23 MED ORDER — EPHEDRINE SULFATE 50 MG/ML IJ SOLN
INTRAMUSCULAR | Status: AC
  Filled 2014-07-23: qty 1

## 2014-07-23 MED ORDER — GLYCOPYRROLATE 0.2 MG/ML IJ SOLN
INTRAMUSCULAR | Status: AC
Start: 2014-07-23 — End: 2014-07-23
  Filled 2014-07-23: qty 3

## 2014-07-23 MED ORDER — LORATADINE 10 MG PO TABS
10.0000 mg | ORAL_TABLET | Freq: Every day | ORAL | Status: DC
  Administered 2014-07-24: 10 mg via ORAL
  Filled 2014-07-23: qty 1

## 2014-07-23 MED ORDER — LIDOCAINE HCL (CARDIAC) 20 MG/ML IV SOLN
INTRAVENOUS | Status: AC
  Filled 2014-07-23: qty 5

## 2014-07-23 MED ORDER — ACETAMINOPHEN 650 MG RE SUPP: 650.0000 mg | RECTAL | Status: DC | PRN

## 2014-07-23 MED ORDER — DEXTROSE 5 % IV SOLN
500.0000 mg | Freq: Four times a day (QID) | INTRAVENOUS | Status: DC | PRN
  Filled 2014-07-23: qty 5

## 2014-07-23 MED ORDER — LIDOCAINE HCL (CARDIAC) 20 MG/ML IV SOLN
INTRAVENOUS | Status: DC | PRN
  Administered 2014-07-23: 50 mg via INTRAVENOUS

## 2014-07-23 MED ORDER — DEXTROSE 5 % IV SOLN
10.0000 mg | INTRAVENOUS | Status: DC | PRN
  Administered 2014-07-23: 50 ug/min via INTRAVENOUS

## 2014-07-23 MED ORDER — HEMOSTATIC AGENTS (NO CHARGE) OPTIME
TOPICAL | Status: DC | PRN
  Administered 2014-07-23: 1 via TOPICAL

## 2014-07-23 MED ORDER — PHENYLEPHRINE 40 MCG/ML (10ML) SYRINGE FOR IV PUSH (FOR BLOOD PRESSURE SUPPORT)
PREFILLED_SYRINGE | INTRAVENOUS | Status: AC
  Filled 2014-07-23: qty 10

## 2014-07-23 MED ORDER — HYDROMORPHONE HCL 1 MG/ML IJ SOLN: 0.2500 mg | INTRAMUSCULAR | Status: DC | PRN

## 2014-07-23 MED ORDER — SODIUM CHLORIDE 0.9 % IJ SOLN: 3.0000 mL | INTRAMUSCULAR | Status: DC | PRN

## 2014-07-23 MED ORDER — VSL#3 PO CAPS: 1.0000 | ORAL_CAPSULE | Freq: Every day | ORAL | Status: DC

## 2014-07-23 MED ORDER — DEXAMETHASONE SODIUM PHOSPHATE 10 MG/ML IJ SOLN
INTRAMUSCULAR | Status: DC | PRN
  Administered 2014-07-23: 10 mg via INTRAVENOUS

## 2014-07-23 MED ORDER — SENNA 8.6 MG PO TABS
1.0000 | ORAL_TABLET | Freq: Two times a day (BID) | ORAL | Status: DC
  Filled 2014-07-23 (×2): qty 1

## 2014-07-23 MED ORDER — ONDANSETRON HCL 4 MG/2ML IJ SOLN
INTRAMUSCULAR | Status: AC
  Filled 2014-07-23: qty 2

## 2014-07-23 MED ORDER — METHOCARBAMOL 500 MG PO TABS: 500.0000 mg | ORAL_TABLET | Freq: Four times a day (QID) | ORAL | Status: DC | PRN

## 2014-07-23 MED ORDER — LACTATED RINGERS IV SOLN
INTRAVENOUS | Status: DC | PRN
  Administered 2014-07-23 (×2): via INTRAVENOUS

## 2014-07-23 MED ORDER — ACETAMINOPHEN 325 MG PO TABS: 650.0000 mg | ORAL_TABLET | ORAL | Status: DC | PRN

## 2014-07-23 MED ORDER — PROPOFOL 10 MG/ML IV BOLUS
INTRAVENOUS | Status: AC
  Filled 2014-07-23: qty 20

## 2014-07-23 MED ORDER — 0.9 % SODIUM CHLORIDE (POUR BTL) OPTIME
TOPICAL | Status: DC | PRN
  Administered 2014-07-23: 1000 mL

## 2014-07-23 MED ORDER — DEXAMETHASONE SODIUM PHOSPHATE 4 MG/ML IJ SOLN
INTRAMUSCULAR | Status: AC
  Filled 2014-07-23: qty 3

## 2014-07-23 MED ORDER — BISACODYL 10 MG RE SUPP: 10.0000 mg | Freq: Every day | RECTAL | Status: DC | PRN

## 2014-07-23 MED ORDER — LACTATED RINGERS IV SOLN
INTRAVENOUS | Status: DC
  Administered 2014-07-23: 11:00:00 via INTRAVENOUS

## 2014-07-23 MED ORDER — SUCCINYLCHOLINE CHLORIDE 20 MG/ML IJ SOLN
INTRAMUSCULAR | Status: AC
  Filled 2014-07-23: qty 1

## 2014-07-23 MED ORDER — ROCURONIUM BROMIDE 50 MG/5ML IV SOLN
INTRAVENOUS | Status: AC
Start: 2014-07-23 — End: 2014-07-23
  Filled 2014-07-23: qty 1

## 2014-07-23 MED ORDER — DOCUSATE SODIUM 100 MG PO CAPS
100.0000 mg | ORAL_CAPSULE | Freq: Two times a day (BID) | ORAL | Status: DC
  Filled 2014-07-23 (×2): qty 1

## 2014-07-23 MED ORDER — MEPERIDINE HCL 25 MG/ML IJ SOLN: 6.2500 mg | INTRAMUSCULAR | Status: DC | PRN

## 2014-07-23 MED ORDER — ROCURONIUM BROMIDE 50 MG/5ML IV SOLN
INTRAVENOUS | Status: AC
  Filled 2014-07-23: qty 1

## 2014-07-23 MED ORDER — HYDROCODONE-ACETAMINOPHEN 5-325 MG PO TABS: 1.0000 | ORAL_TABLET | ORAL | Status: DC | PRN

## 2014-07-23 MED ORDER — ONDANSETRON HCL 4 MG/2ML IJ SOLN
INTRAMUSCULAR | Status: DC | PRN
Start: 2014-07-23 — End: 2014-07-23
  Administered 2014-07-23: 4 mg via INTRAVENOUS

## 2014-07-23 MED ORDER — THROMBIN 5000 UNITS EX SOLR
CUTANEOUS | Status: DC | PRN
  Administered 2014-07-23 (×2): 5000 [IU] via TOPICAL

## 2014-07-23 MED ORDER — PROPOFOL 10 MG/ML IV BOLUS
INTRAVENOUS | Status: DC | PRN
  Administered 2014-07-23: 200 mg via INTRAVENOUS

## 2014-07-23 MED ORDER — ALUM & MAG HYDROXIDE-SIMETH 200-200-20 MG/5ML PO SUSP: 30.0000 mL | Freq: Four times a day (QID) | ORAL | Status: DC | PRN

## 2014-07-23 MED ORDER — PHENOL 1.4 % MT LIQD
1.0000 | OROMUCOSAL | Status: DC | PRN
  Administered 2014-07-24: 1 via OROMUCOSAL
  Filled 2014-07-23: qty 177

## 2014-07-23 MED ORDER — NEOSTIGMINE METHYLSULFATE 10 MG/10ML IV SOLN
INTRAVENOUS | Status: AC
  Filled 2014-07-23: qty 1

## 2014-07-23 MED ORDER — ONDANSETRON HCL 4 MG/2ML IJ SOLN: 4.0000 mg | INTRAMUSCULAR | Status: DC | PRN

## 2014-07-23 MED ORDER — MIDAZOLAM HCL 2 MG/2ML IJ SOLN
INTRAMUSCULAR | Status: AC
  Filled 2014-07-23: qty 2

## 2014-07-23 MED ORDER — ROCURONIUM BROMIDE 100 MG/10ML IV SOLN
INTRAVENOUS | Status: DC | PRN
  Administered 2014-07-23: 50 mg via INTRAVENOUS

## 2014-07-23 MED ORDER — SODIUM CHLORIDE 0.9 % IV SOLN: 250.0000 mL | INTRAVENOUS | Status: DC

## 2014-07-23 MED ORDER — LIDOCAINE HCL (CARDIAC) 20 MG/ML IV SOLN
INTRAVENOUS | Status: AC
  Filled 2014-07-23: qty 10

## 2014-07-23 MED ORDER — CARISOPRODOL 350 MG PO TABS
350.0000 mg | ORAL_TABLET | Freq: Every evening | ORAL | Status: DC
  Filled 2014-07-23 (×2): qty 1

## 2014-07-23 MED ORDER — STERILE WATER FOR INJECTION IJ SOLN
INTRAMUSCULAR | Status: AC
  Filled 2014-07-23: qty 10

## 2014-07-23 MED ORDER — MENTHOL 3 MG MT LOZG: 1.0000 | LOZENGE | OROMUCOSAL | Status: DC | PRN

## 2014-07-23 MED ORDER — SODIUM CHLORIDE 0.9 % IJ SOLN
3.0000 mL | Freq: Two times a day (BID) | INTRAMUSCULAR | Status: DC
  Administered 2014-07-23: 3 mL via INTRAVENOUS

## 2014-07-23 MED ORDER — MIDAZOLAM HCL 2 MG/2ML IJ SOLN: 0.5000 mg | Freq: Once | INTRAMUSCULAR | Status: DC | PRN

## 2014-07-23 MED ORDER — FENTANYL CITRATE (PF) 100 MCG/2ML IJ SOLN
INTRAMUSCULAR | Status: DC | PRN
  Administered 2014-07-23: 250 ug via INTRAVENOUS

## 2014-07-23 MED ORDER — BUPIVACAINE HCL (PF) 0.25 % IJ SOLN
INTRAMUSCULAR | Status: DC | PRN
  Administered 2014-07-23: 2.5 mL

## 2014-07-23 MED ORDER — THYROID 60 MG PO TABS
60.0000 mg | ORAL_TABLET | Freq: Every day | ORAL | Status: DC
  Administered 2014-07-24: 60 mg via ORAL
  Filled 2014-07-23 (×2): qty 1

## 2014-07-23 MED ORDER — ARTIFICIAL TEARS OP OINT
TOPICAL_OINTMENT | OPHTHALMIC | Status: AC
Start: 2014-07-23 — End: 2014-07-23
  Filled 2014-07-23: qty 3.5

## 2014-07-23 MED ORDER — LIDOCAINE-EPINEPHRINE 1 %-1:100000 IJ SOLN
INTRAMUSCULAR | Status: DC | PRN
  Administered 2014-07-23: 2.5 mL

## 2014-07-23 MED ORDER — OXYCODONE-ACETAMINOPHEN 5-325 MG PO TABS
1.0000 | ORAL_TABLET | ORAL | Status: DC | PRN
  Filled 2014-07-23: qty 2

## 2014-07-23 MED ORDER — POTASSIUM CHLORIDE CRYS ER 20 MEQ PO TBCR
20.0000 meq | EXTENDED_RELEASE_TABLET | Freq: Every day | ORAL | Status: DC
  Administered 2014-07-23 – 2014-07-24 (×2): 20 meq via ORAL
  Filled 2014-07-23 (×2): qty 1

## 2014-07-23 MED ORDER — GLYCOPYRROLATE 0.2 MG/ML IJ SOLN
INTRAMUSCULAR | Status: DC | PRN
  Administered 2014-07-23: .7 mg via INTRAVENOUS

## 2014-07-23 MED ORDER — FENTANYL CITRATE (PF) 250 MCG/5ML IJ SOLN
INTRAMUSCULAR | Status: AC
  Filled 2014-07-23: qty 5

## 2014-07-23 MED ORDER — NEOSTIGMINE METHYLSULFATE 10 MG/10ML IV SOLN
INTRAVENOUS | Status: DC | PRN
  Administered 2014-07-23: 4 mg via INTRAVENOUS

## 2014-07-23 MED ORDER — PROMETHAZINE HCL 25 MG/ML IJ SOLN: 6.2500 mg | INTRAMUSCULAR | Status: DC | PRN

## 2014-07-23 SURGICAL SUPPLY — 61 items
ADH SKN CLS APL DERMABOND .7 (GAUZE/BANDAGES/DRESSINGS)
ADH SKN CLS LQ APL DERMABOND (GAUZE/BANDAGES/DRESSINGS) ×1
ALLOGRAFT TRIAD LORDOTIC CC (Bone Implant) ×1 IMPLANT
BAG DECANTER FOR FLEXI CONT (MISCELLANEOUS) ×2 IMPLANT
BIT DRILL NEURO 2X3.1 SFT TUCH (MISCELLANEOUS) ×1 IMPLANT
BNDG GAUZE ELAST 4 BULKY (GAUZE/BANDAGES/DRESSINGS) IMPLANT
BUR BARREL STRAIGHT FLUTE 4.0 (BURR) ×2 IMPLANT
CANISTER SUCT 3000ML PPV (MISCELLANEOUS) ×2 IMPLANT
CONT SPEC 4OZ CLIKSEAL STRL BL (MISCELLANEOUS) ×2 IMPLANT
DECANTER SPIKE VIAL GLASS SM (MISCELLANEOUS) ×2 IMPLANT
DERMABOND ADHESIVE PROPEN (GAUZE/BANDAGES/DRESSINGS) ×1
DERMABOND ADVANCED (GAUZE/BANDAGES/DRESSINGS)
DERMABOND ADVANCED .7 DNX12 (GAUZE/BANDAGES/DRESSINGS) ×1 IMPLANT
DERMABOND ADVANCED .7 DNX6 (GAUZE/BANDAGES/DRESSINGS) IMPLANT
DRAPE LAPAROTOMY 100X72 PEDS (DRAPES) ×2 IMPLANT
DRAPE MICROSCOPE LEICA (MISCELLANEOUS) IMPLANT
DRAPE POUCH INSTRU U-SHP 10X18 (DRAPES) ×2 IMPLANT
DRILL NEURO 2X3.1 SOFT TOUCH (MISCELLANEOUS) ×2
DRSG OPSITE 4X5.5 SM (GAUZE/BANDAGES/DRESSINGS) ×1 IMPLANT
DRSG TELFA 3X8 NADH (GAUZE/BANDAGES/DRESSINGS) IMPLANT
DURAPREP 6ML APPLICATOR 50/CS (WOUND CARE) ×2 IMPLANT
ELECT REM PT RETURN 9FT ADLT (ELECTROSURGICAL) ×2
ELECTRODE REM PT RTRN 9FT ADLT (ELECTROSURGICAL) ×1 IMPLANT
GAUZE SPONGE 4X4 16PLY XRAY LF (GAUZE/BANDAGES/DRESSINGS) IMPLANT
GLOVE BIO SURGEON STRL SZ7.5 (GLOVE) IMPLANT
GLOVE BIOGEL PI IND STRL 7.5 (GLOVE) IMPLANT
GLOVE BIOGEL PI IND STRL 8.5 (GLOVE) ×1 IMPLANT
GLOVE BIOGEL PI INDICATOR 7.5 (GLOVE) ×2
GLOVE BIOGEL PI INDICATOR 8.5 (GLOVE) ×1
GLOVE ECLIPSE 8.5 STRL (GLOVE) ×2 IMPLANT
GLOVE ECLIPSE 9.0 STRL (GLOVE) ×1 IMPLANT
GLOVE EXAM NITRILE LRG STRL (GLOVE) IMPLANT
GLOVE EXAM NITRILE MD LF STRL (GLOVE) IMPLANT
GLOVE EXAM NITRILE XL STR (GLOVE) IMPLANT
GLOVE EXAM NITRILE XS STR PU (GLOVE) IMPLANT
GLOVE SURG SS PI 7.0 STRL IVOR (GLOVE) ×4 IMPLANT
GOWN STRL REUS W/ TWL LRG LVL3 (GOWN DISPOSABLE) IMPLANT
GOWN STRL REUS W/ TWL XL LVL3 (GOWN DISPOSABLE) ×1 IMPLANT
GOWN STRL REUS W/TWL 2XL LVL3 (GOWN DISPOSABLE) ×2 IMPLANT
GOWN STRL REUS W/TWL LRG LVL3 (GOWN DISPOSABLE)
GOWN STRL REUS W/TWL XL LVL3 (GOWN DISPOSABLE) ×6
HALTER HD/CHIN CERV TRACTION D (MISCELLANEOUS) ×2 IMPLANT
KIT BASIN OR (CUSTOM PROCEDURE TRAY) ×2 IMPLANT
KIT ROOM TURNOVER OR (KITS) ×2 IMPLANT
NDL SPNL 22GX3.5 QUINCKE BK (NEEDLE) ×1 IMPLANT
NEEDLE HYPO 22GX1.5 SAFETY (NEEDLE) ×2 IMPLANT
NEEDLE SPNL 22GX3.5 QUINCKE BK (NEEDLE) ×2 IMPLANT
NS IRRIG 1000ML POUR BTL (IV SOLUTION) ×2 IMPLANT
PACK LAMINECTOMY NEURO (CUSTOM PROCEDURE TRAY) ×2 IMPLANT
PAD ARMBOARD 7.5X6 YLW CONV (MISCELLANEOUS) ×6 IMPLANT
PAD DRESSING TELFA 3X8 NADH (GAUZE/BANDAGES/DRESSINGS) ×1 IMPLANT
PLATE ARCHON 1-LEVEL 28MM (Plate) ×1 IMPLANT
RUBBERBAND STERILE (MISCELLANEOUS) IMPLANT
SCREW ARCHON ST VAR 4.5X13MM (Screw) ×4 IMPLANT
SPONGE INTESTINAL PEANUT (DISPOSABLE) ×2 IMPLANT
SPONGE SURGIFOAM ABS GEL SZ50 (HEMOSTASIS) ×2 IMPLANT
SUT VIC AB 3-0 SH 8-18 (SUTURE) ×2 IMPLANT
SYR 20ML ECCENTRIC (SYRINGE) ×1 IMPLANT
TOWEL OR 17X24 6PK STRL BLUE (TOWEL DISPOSABLE) ×2 IMPLANT
TOWEL OR 17X26 10 PK STRL BLUE (TOWEL DISPOSABLE) ×2 IMPLANT
WATER STERILE IRR 1000ML POUR (IV SOLUTION) ×2 IMPLANT

## 2014-07-23 NOTE — Op Note (Signed)
Date of surgery: 07/23/2014 Preoperative diagnosis: Cervical spondylosis C4-C5, status post arthrodesis C3-4 C5-6, cervical radiculopathy Postoperative diagnosis: Cervical spondylosis C4-5 status post arthrodesis C3-4 C5-6, cervical radiculopathy Procedure: Removal of previously placed anterior plate Z6-1C3-4 W9-6C5-6, anterior cervical decompression C4-C5 arthrodesis with structural allograft, anterior plate fixation E4-V4C4-C5. Surgeon: Barnett AbuHenry Ceriah Kohler First Asst.: Delma OfficerJeff Jenkins M.D. Anesthesia: Gen. endotracheal Indications: Angela ReaperMary Dudley is a 69 year old individual's had significant problems with neck and shoulder pain she has evidence of spondylosis at C4-C5 that is worsened considerably since his surgery last year was performed to decompress and stabilize C3-4 and C5-6. She is advised regarding the need for surgical decompression and was offered considerable effort for conservative management but she declined conservative effort stating that that never worked for her before. She is taken to the operating room to undergo surgical decompression.  Procedure: Patient was brought to the operating supine on a stretcher after the smooth induction of general endotracheal anesthesia she was placed on the operating table in supine position. The neck was placed in slight extension in 5 pounds of halter traction. The previously made incision was then reopened after prepping the neck with alcohol and DuraPrep and draping in a sterile fashion. Dissection was carried down carefully through the platysma soft tissues and ultimately into the prevertebral space. The area of the previous plate was identified and this was skeletonized at C5-C6. The dissection was then carried cephalad to expose this plate at U9-W1C3-C4 care was taken to mobilize the soft tissues and particularly the region of the esophagus from medial to lateral. Then the anterior plate was removed at C3-C4 removing all the screws and then lifting the plate. C4-5 was exposed and  the process in the plate at X9-1C5-6 was removed. A Caspar type or tractor with long teeth was then placed to retract the soft tissues under the longus coli muscle at C4-C5 level. A discectomy was performed at C4-C5. The disc space was entered and significant edema grimace degenerated disc material was encountered and this was removed. The region of the posterior longitudinal ligament was explored. A centrally herniated disc was noted at the C4-C5 level and this was removed. The dissection was carried out laterally to decompress the pads of of the nerve roots. Hemostasis was obtained in the epidural space and the soft tissues. The endplates were rongeured smooth and drilled smoothed and then the interspace was sized for appropriately sized graft which was measured at 6 mm. A 6 mm graft was inserted into the interspace. After this a 26 mm singular level plate was then placed over the ventral aspect of the vertebral bodies and secured with 4.35 variable angle locking screws. Traction was removed. Construct appeared intact. The wound was irrigated copiously with a pneumatic irrigating solution. Hemostasis was meticulously obtained and then the cervical retractor was removed and a final radiograph was obtained. Good placement of the plate and hardware was identified. The platysma was then closed with 30 Vicryls interrupted fashion and 30 Vicryls used in the subcuticular tissues. Dermabond was placed on the skin. Blood loss is estimated at less than 20 mL.

## 2014-07-23 NOTE — Anesthesia Procedure Notes (Signed)
Procedure Name: Intubation Date/Time: 07/23/2014 2:36 PM Performed by: Gwenyth AllegraADAMI, Shayleen Eppinger Pre-anesthesia Checklist: Patient identified, Timeout performed, Emergency Drugs available, Suction available and Patient being monitored Patient Re-evaluated:Patient Re-evaluated prior to inductionOxygen Delivery Method: Circle system utilized and Ambu bag Preoxygenation: Pre-oxygenation with 100% oxygen Intubation Type: IV induction Ventilation: Oral airway inserted - appropriate to patient size and Mask ventilation without difficulty Laryngoscope Size: Glidescope and 4 Grade View: Grade II Tube type: Oral Tube size: 7.0 mm Number of attempts: 1 Airway Equipment and Method: Video-laryngoscopy Secured at: 21 cm Tube secured with: Tape Dental Injury: Teeth and Oropharynx as per pre-operative assessment

## 2014-07-23 NOTE — H&P (Signed)
Angela Dudley is an 69 y.o. female.   Chief Complaint: Neck and arm pain. Status post arthrodesis C3-4 and C5-10 October 2013 HPI: Angela Dudley is a 69 year old individual who sure underwent decompression arthrodesis at C3-4 and C5-6. She initially felt quite well and was doing reasonably well with good relief of her radicular symptoms and substantial neck pain she's been having for a number of years. In the past few months she is deteriorated and she had some incidents at home or she's fallen an MRI demonstrates that she now is evolving substantial spondylitic changes with degeneration of the disc at the C4-C5 level. She's been advised regarding surgical intervention for this and desires to have this done at the earliest convenience declining efforts at conservative management she notes that she cannot tolerate the pain.  Past Medical History  Diagnosis Date  . GERD (gastroesophageal reflux disease)   . Allergy   . Hyperlipidemia   . Thyroid disease     Unspecified hypothyroidism  . Other B-complex deficiencies   . Obesity   . Osteoarthritis   . Degenerative disc disease   . Back pain   . Wart viral   . Cramps, muscle, general   . Abdominal pain, generalized   . Peptic ulcer disease   . Unspecified intestinal obstruction   . Chronic fatigue syndrome   . Peritoneal adhesions (postoperative) (postinfection)   . Diverticulosis of colon (without mention of hemorrhage)   . Sarcoidosis   . Idiopathic peripheral autonomic neuropathy   . Fever   . Headache(784.0)   . Orthostatic hypotension     with prev tilt table test  . H/O hiatal hernia   . Esophageal hernia   . Shortness of breath     exertion  . Hypothyroidism   . Dizziness   . Numbness and tingling     hands, feet, & lips  . Hemorrhoids   . UTI (lower urinary tract infection)     hx of  . Frequent urination at night   . Fibromyalgia   . IBS (irritable bowel syndrome)   . Anxiety     Past Surgical History  Procedure  Laterality Date  . Doppler arterial studies  05/13/2008    Both carotid systems demonstrate minimal plaque formation with normal flow velocities throughout.  Vertebrals demonstrate antegrade flow bilaterally.  . Nuclear test  08/2005    Normal (chest pain)  . Doppler echocardiography  2010    Diastolic relaxation abnormality, mild AVSC, mild MR/TR and mild to mod AR  . Right hip bursa removal  1991  . Partial colectomy  1996    complicated by an abscess formation  . Lasik  1998    Cataract  . Sbo  2002  . Hernia repair  2005 and 2006    Ventral  . Nose surgery  2006  . Appendectomy    . Cholecystectomy    . Tonsillectomy    . Sarcoidosis  1994  . Biopsy thyroid      benign  . Colon surgery  1996    partial colon removal  . Carpal tunnel release Bilateral   . Belpharoptosis repair Bilateral   . Anterior cervical decomp/discectomy fusion N/A 10/16/2013    Procedure: Cervical three-four, Cervical five-six Anterior cervical decompression/diskectomy/fusion;  Surgeon: Barnett Abu, MD;  Location: [facility];  Service: Neurosurgery;  Laterality: N/A;  Cervical three-four, Cervical five-six Anterior cervical decompression/diskectomy/fusion  . Cataract extraction Bilateral 1998  . Nasal septum surgery  2006  .  Back surgery  2015    herniated disc    Family History  Problem Relation Age of Onset  . Hyperlipidemia Neg Hx     No history of early CAD  . Cancer Neg Hx     No history of lymphoma or leukemia  . Crohn's disease Mother   . Heart disease Father    Social History:  reports that she has never smoked. She does not have any smokeless tobacco history on file. She reports that she does not drink alcohol or use illicit drugs.  Allergies:  Allergies  Allergen Reactions  . Esomeprazole Magnesium     REACTION: Esophageal spasm  . Other Other (See Comments)    Hepatotoxic Gases per MD request this is not to be used due to Chronic Fatigue Syndrome creates risk for Hepatitis.   Marland Kitchen  Percocet [Oxycodone-Acetaminophen]     It "wires me" , I can't sleep  . Amoxicillin Rash    Medications Prior to Admission  Medication Sig Dispense Refill  . B Complex-C (B-COMPLEX WITH VITAMIN C) tablet Take 1 tablet by mouth daily.    . Biotin 5000 MCG CAPS Take 1 capsule by mouth daily.    . carisoprodol (SOMA) 350 MG tablet Take 350 mg by mouth every evening.     . cetirizine (ZYRTEC) 10 MG tablet Take 10 mg by mouth daily.    . Coenzyme Q10 (CO Q 10 PO) Take 1 capsule by mouth daily.    . fish oil-omega-3 fatty acids 1000 MG capsule Take 3 g by mouth daily.     Marland Kitchen GNP POTASSIUM GLUCONATE PO Take 1 capsule by mouth daily.    Marland Kitchen ipratropium (ATROVENT) 0.03 % nasal spray Place 2 sprays into both nostrils every 12 (twelve) hours.    . Lecithin 1200 MG CAPS Take 2 capsules by mouth every 4 (four) hours as needed. Uses to help with pain.    . magnesium gluconate (MAGONATE) 500 MG tablet Take 500 mg by mouth daily.     . potassium chloride SA (K-DUR,KLOR-CON) 20 MEQ tablet Take 1 tablet (20 mEq total) by mouth daily. 90 tablet 1  . Probiotic Product (VSL#3) CAPS Take 1 capsule by mouth daily.    . Selenium 200 MCG CAPS Take 1 capsule by mouth daily.     Marland Kitchen thyroid (ARMOUR) 60 MG tablet Take 60 mg by mouth daily before breakfast.    . zinc gluconate 50 MG tablet Take 50 mg by mouth daily.      . calcium carbonate (OS-CAL - DOSED IN MG OF ELEMENTAL CALCIUM) 1250 MG tablet Take 1 tablet by mouth daily with breakfast.      No results found for this or any previous visit (from the past 48 hour(s)). No results found.  Review of Systems  Eyes: Negative.   Respiratory: Negative.   Cardiovascular: Negative.   Gastrointestinal: Negative.   Genitourinary: Negative.   Musculoskeletal: Positive for neck pain.  Skin: Negative.   Neurological: Positive for weakness.  Endo/Heme/Allergies: Negative.   Psychiatric/Behavioral: Negative.     Blood pressure 139/61, pulse 90, temperature 97 F (36.1  C), temperature source Oral, resp. rate 20, weight 103.874 kg (229 lb), SpO2 93 %. Physical Exam  Constitutional: She appears well-developed and well-nourished.  HENT:  Head: Normocephalic and atraumatic.  Eyes: Conjunctivae and EOM are normal. Pupils are equal, round, and reactive to light.  Cardiovascular: Normal rate and regular rhythm.   Respiratory: Effort normal and breath sounds normal.  GI: Soft.  Bowel sounds are normal.  Musculoskeletal: Normal range of motion.  Neurological:  Good motor function in upper and lower extremities. Absent reflexes in biceps and triceps bilaterally.  Skin: Skin is warm and dry.  Psychiatric: She has a normal mood and affect. Her behavior is normal. Judgment and thought content normal.     Assessment/Plan Cervical spondylosis with radiculopathy C4-C5. Status post decompression C3-4 and C5-6 and October 2015 Plan: Anterior cervical decompression arthrodesis C4-C5.  Huel Centola J 07/23/2014, 1:52 PM

## 2014-07-23 NOTE — Transfer of Care (Signed)
Immediate Anesthesia Transfer of Care Note  Patient: Angela Dudley  Procedure(s) Performed: Procedure(s) with comments: Cervical four-five Anterior cervical decompression/diskectomy/fusion (N/A) - C4-5 Anterior cervical decompression/diskectomy/fusion  Patient Location: PACU  Anesthesia Type:General  Level of Consciousness: awake, alert  and oriented  Airway & Oxygen Therapy: Patient Spontanous Breathing and Patient connected to nasal cannula oxygen  Post-op Assessment: Report given to RN and Post -op Vital signs reviewed and stable  Post vital signs: Reviewed and stable  Last Vitals:  Filed Vitals:   07/23/14 1037  BP: 139/61  Pulse: 90  Temp: 36.1 C  Resp: 20    Complications: No apparent anesthesia complications

## 2014-07-23 NOTE — Anesthesia Preprocedure Evaluation (Addendum)
Anesthesia Evaluation  Patient identified by MRN, date of birth, ID band Patient awake    Reviewed: Allergy & Precautions, NPO status , Patient's Chart, lab work & pertinent test results  History of Anesthesia Complications Negative for: history of anesthetic complications  Airway Mallampati: II  TM Distance: >3 FB Neck ROM: Full    Dental  (+) Dental Advisory Given   Pulmonary shortness of breath,  breath sounds clear to auscultation        Cardiovascular - anginanegative cardio ROS  Rhythm:Regular Rate:Normal  '03 stress: EF 83%, no ischemia, normal perfusion   Neuro/Psych Anxiety    GI/Hepatic Neg liver ROS, GERD-  Medicated and Controlled,  Endo/Other  Hypothyroidism Morbid obesity  Renal/GU negative Renal ROS     Musculoskeletal  (+) Arthritis -, Osteoarthritis,  Fibromyalgia -  Abdominal (+) + obese,   Peds  Hematology negative hematology ROS (+)   Anesthesia Other Findings   Reproductive/Obstetrics                           Anesthesia Physical Anesthesia Plan  ASA: III  Anesthesia Plan: General   Post-op Pain Management:    Induction: Intravenous  Airway Management Planned: Oral ETT  Additional Equipment:   Intra-op Plan:   Post-operative Plan: Extubation in OR  Informed Consent: I have reviewed the patients History and Physical, chart, labs and discussed the procedure including the risks, benefits and alternatives for the proposed anesthesia with the patient or authorized representative who has indicated his/her understanding and acceptance.   Dental advisory given  Plan Discussed with: CRNA and Surgeon  Anesthesia Plan Comments: (Plan routine monitors, GETA)        Anesthesia Quick Evaluation

## 2014-07-23 NOTE — Progress Notes (Signed)
Patient ID: Angela MealyMary B Dudley, female   DOB: 10/07/1945, 69 y.o.   MRN: [id number] Vital signs are stable patient is awake and alert animal throat soreness. Motor function is intact.

## 2014-07-23 NOTE — Anesthesia Postprocedure Evaluation (Signed)
  Anesthesia Post-op Note  Patient: Angela Dudley  Procedure(s) Performed: Procedure(s) with comments: Cervical four-five Anterior cervical decompression/diskectomy/fusion (N/A) - C4-5 Anterior cervical decompression/diskectomy/fusion  Patient Location: PACU  Anesthesia Type:General  Level of Consciousness: awake, alert , oriented and patient cooperative  Airway and Oxygen Therapy: Patient Spontanous Breathing and Patient connected to nasal cannula oxygen  Post-op Pain: none  Post-op Assessment: Post-op Vital signs reviewed, Patient's Cardiovascular Status Stable, Respiratory Function Stable, Patent Airway, No signs of Nausea or vomiting and Pain level controlled              Post-op Vital Signs: Reviewed and stable  Last Vitals:  Filed Vitals:   07/23/14 1623  BP: 133/63  Pulse: 99  Temp: 36.7 C  Resp: 17    Complications: No apparent anesthesia complications

## 2014-07-23 NOTE — Progress Notes (Signed)
Pt received at this time alert, verbal with no noted distress. Pt stable, neuro intact able to move all extremities. She denies pain or discomfort. Surgical site to neck clean, dry and intact dermabond Ambulated to the bathroom with this nurse stand by assist with no assistive device needed. Gait steady. Safety measures in place. Pt oriented to room. Call bell within reach. Will continue to monitor.

## 2014-07-24 ENCOUNTER — Encounter (HOSPITAL_COMMUNITY): Payer: Self-pay | Admitting: Neurological Surgery

## 2014-07-24 DIAGNOSIS — M47812 Spondylosis without myelopathy or radiculopathy, cervical region: Secondary | ICD-10-CM | POA: Diagnosis not present

## 2014-07-24 NOTE — Discharge Summary (Signed)
Physician Discharge Summary  Patient ID: Angela Dudley MRN: [id number] DOB/AGE: 67/04/1945 67 y.o.  Admit date: 07/23/2014 Discharge date: 07/24/2014  Admission Diagnoses: Cervical spondylosis with myelopathy C4-C5, status post arthrodesis C3-4 and C5-6  Discharge Diagnoses: Cervical spondylosis with myelopathy C4-5. Status post arthrodesis C3-4, C5-6  Active Problems:   Cervical spondylosis   Discharged Condition: good  Hospital Course: Patient was admitted to undergo anterior decompression surgery at C4-5. She tolerated this well.  Consults: None  Significant Diagnostic Studies: None  Treatments: surgery: Anterior cervical decompression C4-5. Removal of hardware C3-4 and C5-6  Discharge Exam: Blood pressure 149/64, pulse 102, temperature 97.8 F (36.6 C), temperature source Oral, resp. rate 20, weight 103.874 kg (229 lb), SpO2 94 %. Incision is clean and dry motor function is intact in the upper extremities station and gait is intact  Disposition: 01-Home or Self Care     Medication List    ASK your doctor about these medications        B-complex with vitamin C tablet  Take 1 tablet by mouth daily.     Biotin 5000 MCG Caps  Take 1 capsule by mouth daily.     calcium carbonate 1250 (500 CA) MG tablet  Commonly known as:  OS-CAL - dosed in mg of elemental calcium  Take 1 tablet by mouth daily with breakfast.     carisoprodol 350 MG tablet  Commonly known as:  SOMA  Take 350 mg by mouth every evening.     cetirizine 10 MG tablet  Commonly known as:  ZYRTEC  Take 10 mg by mouth daily.     CO Q 10 PO  Take 1 capsule by mouth daily.     fish oil-omega-3 fatty acids 1000 MG capsule  Take 3 g by mouth daily.     GNP POTASSIUM GLUCONATE PO  Take 1 capsule by mouth daily.     ipratropium 0.03 % nasal spray  Commonly known as:  ATROVENT  Place 2 sprays into both nostrils every 12 (twelve) hours.     Lecithin 1200 MG Caps  Take 2 capsules by mouth every 4  (four) hours as needed. Uses to help with pain.     magnesium gluconate 500 MG tablet  Commonly known as:  MAGONATE  Take 500 mg by mouth daily.     potassium chloride SA 20 MEQ tablet  Commonly known as:  K-DUR,KLOR-CON  Take 1 tablet (20 mEq total) by mouth daily.     Selenium 200 MCG Caps  Take 1 capsule by mouth daily.     thyroid 60 MG tablet  Commonly known as:  ARMOUR  Take 60 mg by mouth daily before breakfast.     VSL#3 Caps  Take 1 capsule by mouth daily.     zinc gluconate 50 MG tablet  Take 50 mg by mouth daily.         SignedStefani Dama: Johnn Krasowski J 07/24/2014, 6:45 PM

## 2014-07-24 NOTE — OR Nursing (Signed)
Late entry for delay code documentation. 

## 2014-07-24 NOTE — Progress Notes (Signed)
Pt requested for this nurse to contact MD in reference to her being discharged today. Pt stated that she called a ride and they will be picking her up at 6pm and if she is not discharged by Md she would have to go AMA because she is from [place]. Pt was informed that she had no discharge orders at the time but encouraged not to go AMA. She was made aware of risks of leaving AMA. Called MD office,  spoke with Md nurse  who will notify Md when he is available. Will monitor.

## 2014-07-24 NOTE — Progress Notes (Signed)
Pt discharging at this time alert, verbal taking all personal belongings. Surgical site clean, dry and intact.  IV discontinued, dry dressing applied. Discharge instructions provided with verbal understanding. No noted distress. Pt denies pain or discomfort.

## 2015-05-09 ENCOUNTER — Other Ambulatory Visit: Payer: Self-pay | Admitting: Orthopedic Surgery

## 2015-05-09 DIAGNOSIS — M25511 Pain in right shoulder: Secondary | ICD-10-CM

## 2015-05-18 ENCOUNTER — Ambulatory Visit
Admission: RE | Admit: 2015-05-18 | Discharge: 2015-05-18 | Disposition: A | Discharge status: Home / Self Care | Payer: Medicare Other | Source: Ambulatory Visit | Attending: Orthopedic Surgery | Admitting: Orthopedic Surgery

## 2015-05-18 DIAGNOSIS — M25511 Pain in right shoulder: Secondary | ICD-10-CM

## 2015-06-06 ENCOUNTER — Other Ambulatory Visit: Payer: Self-pay | Admitting: Orthopedic Surgery

## 2015-07-22 ENCOUNTER — Other Ambulatory Visit (HOSPITAL_COMMUNITY): Payer: Medicare Other

## 2015-07-29 ENCOUNTER — Ambulatory Visit (HOSPITAL_COMMUNITY): Admission: RE | Admit: 2015-07-29 | Payer: Medicare Other | Source: Ambulatory Visit | Admitting: Orthopedic Surgery

## 2015-07-29 ENCOUNTER — Encounter (HOSPITAL_COMMUNITY): Admission: RE | Payer: Self-pay | Source: Ambulatory Visit

## 2015-07-29 SURGERY — ARTHROSCOPY, SHOULDER WITH REPAIR, ROTATOR CUFF, OPEN
Anesthesia: Choice | Laterality: Right

## 2016-05-20 IMAGING — CR DG CERVICAL SPINE 1V
1 series · 1 of 1 positions shown · non-contrast
Comparison: MRI July 02, 2014.

CLINICAL DATA: Status post anterior cervical fusion of C4-5.

EXAM:
DG CERVICAL SPINE - 1 VIEW

[lat]
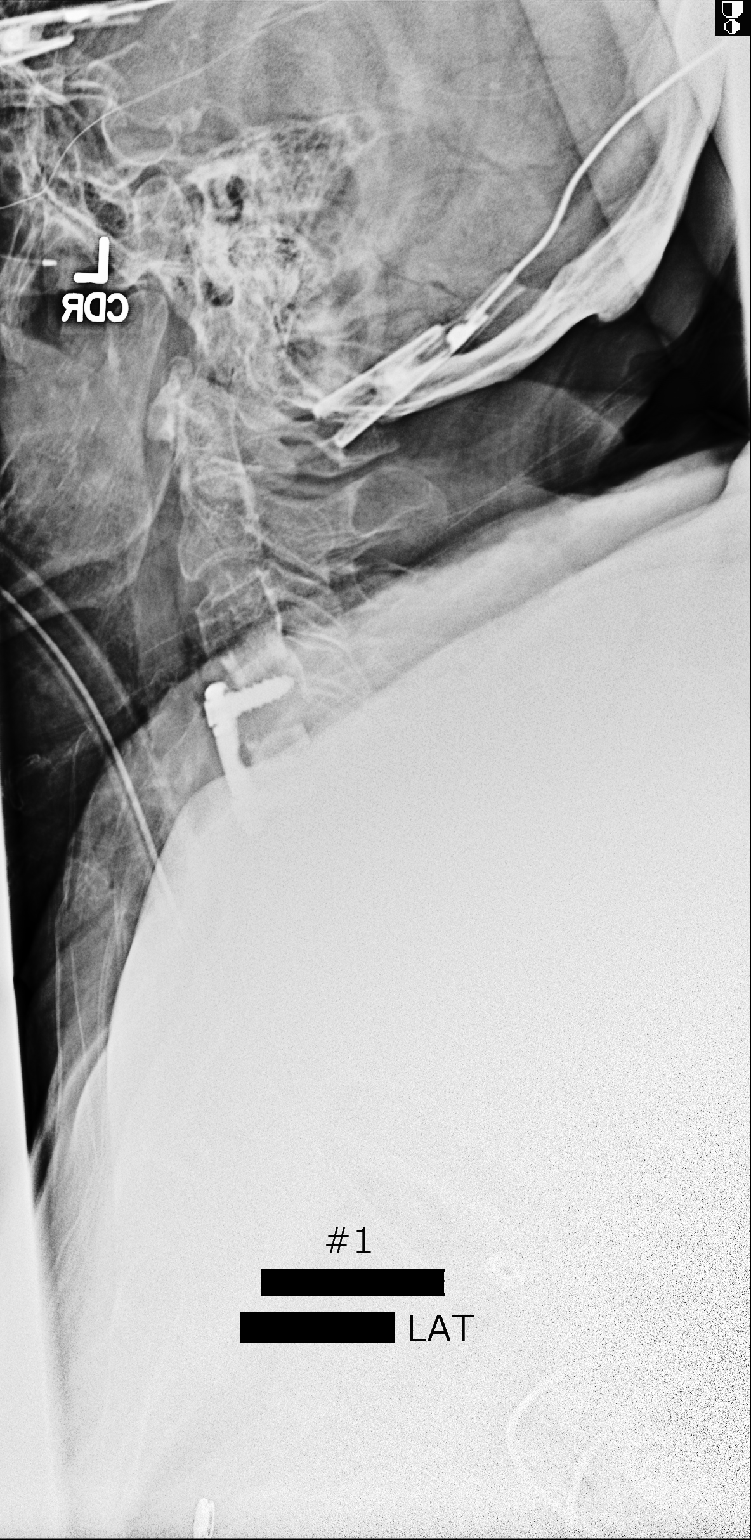

[1 of 1 positions shown; findings below may reference images not displayed]

FINDINGS: Single cross-table portable projection of cervical spine
demonstrates the patient be status post surgical anterior fusion of
C4-5 with interbody fusion. Grossly good alignment of visualized
vertebral bodies is noted.
IMPRESSION: Status post surgical anterior fusion of C4-5.

## 2016-06-08 ENCOUNTER — Encounter: Payer: Self-pay | Admitting: Neurology

## 2016-06-08 ENCOUNTER — Encounter (INDEPENDENT_AMBULATORY_CARE_PROVIDER_SITE_OTHER): Payer: Self-pay

## 2016-06-08 ENCOUNTER — Ambulatory Visit (INDEPENDENT_AMBULATORY_CARE_PROVIDER_SITE_OTHER): Payer: Medicare Other | Admitting: Neurology

## 2016-06-08 DIAGNOSIS — R51 Headache: Secondary | ICD-10-CM

## 2016-06-08 DIAGNOSIS — G4486 Cervicogenic headache: Secondary | ICD-10-CM

## 2016-06-08 HISTORY — DX: Cervicogenic headache: G44.86
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
## 2016-06-08 NOTE — Patient Instructions (Signed)
   We will check blood work today and get MRI of the brain. 

## 2016-06-09 ENCOUNTER — Telehealth: Payer: Self-pay | Admitting: *Deleted

## 2016-06-09 LAB — SEDIMENTATION RATE: Sed Rate: 7 mm/hr (ref 0–40)

## 2016-06-09 NOTE — Telephone Encounter (Signed)
LVM for pt to call about results.   *ok to inform patient labs unremarkable per CW,MD if she calls

## 2016-06-09 NOTE — Telephone Encounter (Signed)
Noted  

## 2016-06-09 NOTE — Telephone Encounter (Signed)
Patient called office returning RN's call.  I have advised patient of the below message of labs being remarkable per Dr. Anne HahnWillis.  Patient voiced understanding and appreciation.

## 2016-06-09 NOTE — Telephone Encounter (Signed)
-----   Message from York Spanielharles K Willis, MD sent at 06/09/2016  7:14 AM EDT -----   The blood work results are unremarkable. Please call the patient.  ----- Message ----- From: Nell RangeInterface, [facility] Lab Results In Sent: 06/09/2016   5:40 AM To: York Spanielharles K Willis, MD

## 2016-08-04 ENCOUNTER — Other Ambulatory Visit: Payer: Self-pay | Admitting: Ophthalmology

## 2016-08-18 ENCOUNTER — Encounter (HOSPITAL_COMMUNITY): Payer: Self-pay | Admitting: *Deleted

## 2016-08-18 NOTE — Progress Notes (Signed)
I instructed Ms Angela Dudley to stop all herbal medications, patient said that the medications are not reallty herbal, that she needs them for sleep and other things.   I instructed patient to call Dr Luciana Axeankin and ask for clearance to take them.  Patient said that she needs to take some of her medications with food.  I explained to her that she is NPO after midnight.  "At times they have let me eat 8 hours prior to surgery, I informed her that anesthesiologist have ordered her NPO for 1230 surgery.

## 2016-08-20 ENCOUNTER — Ambulatory Visit (HOSPITAL_COMMUNITY): Payer: Medicare Other | Admitting: Certified Registered Nurse Anesthetist

## 2016-08-20 ENCOUNTER — Encounter (HOSPITAL_COMMUNITY): Payer: Self-pay

## 2016-08-20 ENCOUNTER — Encounter (HOSPITAL_COMMUNITY)
Admission: RE | Disposition: A | Discharge status: Home / Self Care | Payer: Self-pay | Source: Ambulatory Visit | Attending: Ophthalmology

## 2016-08-20 ENCOUNTER — Ambulatory Visit (HOSPITAL_COMMUNITY)
Admission: RE | Admit: 2016-08-20 | Discharge: 2016-08-21 | Disposition: A | Discharge status: Home / Self Care | Payer: Medicare Other | Source: Ambulatory Visit | Attending: Ophthalmology | Admitting: Ophthalmology

## 2016-08-20 DIAGNOSIS — H43392 Other vitreous opacities, left eye: Secondary | ICD-10-CM | POA: Diagnosis not present

## 2016-08-20 DIAGNOSIS — Z79899 Other long term (current) drug therapy: Secondary | ICD-10-CM | POA: Insufficient documentation

## 2016-08-20 DIAGNOSIS — H5462 Unqualified visual loss, left eye, normal vision right eye: Secondary | ICD-10-CM | POA: Diagnosis not present

## 2016-08-20 DIAGNOSIS — G4730 Sleep apnea, unspecified: Secondary | ICD-10-CM | POA: Diagnosis present

## 2016-08-20 HISTORY — DX: Other vitreous opacities, left eye: H43.392

## 2016-08-20 HISTORY — PX: PARS PLANA VITRECTOMY: SHX2166

## 2016-08-20 LAB — BASIC METABOLIC PANEL
ANION GAP: 10 (ref 5–15)
BUN: 8 mg/dL (ref 6–20)
CO2: 24 mmol/L (ref 22–32)
Calcium: 9.6 mg/dL (ref 8.9–10.3)
Chloride: 105 mmol/L (ref 101–111)
Creatinine, Ser: 0.59 mg/dL (ref 0.44–1.00)
GLUCOSE: 127 mg/dL — AB (ref 65–99)
POTASSIUM: 4.1 mmol/L (ref 3.5–5.1)
Sodium: 139 mmol/L (ref 135–145)

## 2016-08-20 LAB — CBC
HEMATOCRIT: 43.5 % (ref 36.0–46.0)
HEMOGLOBIN: 14.8 g/dL (ref 12.0–15.0)
MCH: 30.5 pg (ref 26.0–34.0)
MCHC: 34 g/dL (ref 30.0–36.0)
MCV: 89.5 fL (ref 78.0–100.0)
Platelets: 220 10*3/uL (ref 150–400)
RBC: 4.86 MIL/uL (ref 3.87–5.11)
RDW: 13.6 % (ref 11.5–15.5)
WBC: 7 10*3/uL (ref 4.0–10.5)

## 2016-08-20 SURGERY — PARS PLANA VITRECTOMY WITH 25 GAUGE
Anesthesia: Monitor Anesthesia Care | Site: Eye | Laterality: Left

## 2016-08-20 MED ORDER — FENTANYL CITRATE (PF) 100 MCG/2ML IJ SOLN: 25.0000 ug | INTRAMUSCULAR | Status: DC | PRN

## 2016-08-20 MED ORDER — BACITRACIN-POLYMYXIN B 500-10000 UNIT/GM OP OINT
TOPICAL_OINTMENT | OPHTHALMIC | Status: AC
  Filled 2016-08-20: qty 3.5

## 2016-08-20 MED ORDER — SODIUM CHLORIDE 0.9 % IJ SOLN
INTRAMUSCULAR | Status: AC
  Filled 2016-08-20: qty 20

## 2016-08-20 MED ORDER — GATIFLOXACIN 0.5 % OP SOLN
OPHTHALMIC | Status: AC
Start: 2016-08-20 — End: 2016-08-20
  Administered 2016-08-20: 1 [drp] via OPHTHALMIC
  Filled 2016-08-20: qty 2.5

## 2016-08-20 MED ORDER — EPINEPHRINE PF 1 MG/ML IJ SOLN
INTRAMUSCULAR | Status: AC
  Filled 2016-08-20: qty 1

## 2016-08-20 MED ORDER — HYPROMELLOSE (GONIOSCOPIC) 2.5 % OP SOLN
OPHTHALMIC | Status: AC
  Filled 2016-08-20: qty 15

## 2016-08-20 MED ORDER — DEXAMETHASONE SODIUM PHOSPHATE 10 MG/ML IJ SOLN
INTRAMUSCULAR | Status: AC
  Filled 2016-08-20: qty 2

## 2016-08-20 MED ORDER — FENTANYL CITRATE (PF) 250 MCG/5ML IJ SOLN
INTRAMUSCULAR | Status: AC
  Filled 2016-08-20: qty 5

## 2016-08-20 MED ORDER — PROPOFOL 10 MG/ML IV BOLUS
INTRAVENOUS | Status: DC | PRN
  Administered 2016-08-20: 60 mg via INTRAVENOUS

## 2016-08-20 MED ORDER — EPINEPHRINE PF 1 MG/10ML IJ SOSY
PREFILLED_SYRINGE | INTRAMUSCULAR | Status: AC
  Filled 2016-08-20: qty 10

## 2016-08-20 MED ORDER — LIDOCAINE HCL 2 % IJ SOLN
INTRAMUSCULAR | Status: AC
  Filled 2016-08-20: qty 20

## 2016-08-20 MED ORDER — TOBRAMYCIN-DEXAMETHASONE 0.3-0.1 % OP OINT
TOPICAL_OINTMENT | OPHTHALMIC | Status: DC | PRN
  Administered 2016-08-20: 1 via OPHTHALMIC

## 2016-08-20 MED ORDER — BSS IO SOLN
INTRAOCULAR | Status: AC
  Filled 2016-08-20: qty 15

## 2016-08-20 MED ORDER — SODIUM CHLORIDE 0.9 % IV SOLN
INTRAVENOUS | Status: DC
  Administered 2016-08-20: 11:00:00 via INTRAVENOUS

## 2016-08-20 MED ORDER — HYPROMELLOSE (GONIOSCOPIC) 2.5 % OP SOLN
OPHTHALMIC | Status: DC | PRN
  Administered 2016-08-20: 2 [drp] via OPHTHALMIC

## 2016-08-20 MED ORDER — GENTAMICIN SULFATE 40 MG/ML IJ SOLN
INTRAMUSCULAR | Status: AC
  Filled 2016-08-20: qty 2

## 2016-08-20 MED ORDER — PHENYLEPHRINE HCL 2.5 % OP SOLN
1.0000 [drp] | OPHTHALMIC | Status: AC
  Administered 2016-08-20 (×3): 1 [drp] via OPHTHALMIC

## 2016-08-20 MED ORDER — SODIUM CHLORIDE 0.9 % IV SOLN
INTRAVENOUS | Status: DC
  Administered 2016-08-20: 15:00:00 via INTRAVENOUS

## 2016-08-20 MED ORDER — GATIFLOXACIN 0.5 % OP SOLN
1.0000 [drp] | OPHTHALMIC | Status: AC
  Administered 2016-08-20 (×3): 1 [drp] via OPHTHALMIC

## 2016-08-20 MED ORDER — ONDANSETRON HCL 4 MG/2ML IJ SOLN: 4.0000 mg | Freq: Three times a day (TID) | INTRAMUSCULAR | Status: DC | PRN

## 2016-08-20 MED ORDER — GATIFLOXACIN 0.5 % OP SOLN
1.0000 [drp] | Freq: Four times a day (QID) | OPHTHALMIC | Status: DC
  Administered 2016-08-21: 1 [drp] via OPHTHALMIC

## 2016-08-20 MED ORDER — ROCURONIUM BROMIDE 10 MG/ML (PF) SYRINGE
PREFILLED_SYRINGE | INTRAVENOUS | Status: AC
  Filled 2016-08-20: qty 10

## 2016-08-20 MED ORDER — EPINEPHRINE PF 1 MG/ML IJ SOLN
INTRAMUSCULAR | Status: DC | PRN
  Administered 2016-08-20: .3 mL

## 2016-08-20 MED ORDER — ONDANSETRON HCL 4 MG/2ML IJ SOLN
INTRAMUSCULAR | Status: AC
  Filled 2016-08-20: qty 4

## 2016-08-20 MED ORDER — METOCLOPRAMIDE HCL 5 MG/ML IJ SOLN: 10.0000 mg | Freq: Once | INTRAMUSCULAR | Status: DC | PRN

## 2016-08-20 MED ORDER — LIDOCAINE HCL 2 % IJ SOLN
INTRAMUSCULAR | Status: DC | PRN
  Administered 2016-08-20: 20 mL

## 2016-08-20 MED ORDER — SUCCINYLCHOLINE CHLORIDE 200 MG/10ML IV SOSY
PREFILLED_SYRINGE | INTRAVENOUS | Status: AC
  Filled 2016-08-20: qty 10

## 2016-08-20 MED ORDER — CYCLOPENTOLATE HCL 1 % OP SOLN
1.0000 [drp] | OPHTHALMIC | Status: AC
  Administered 2016-08-20 (×3): 1 [drp] via OPHTHALMIC
  Filled 2016-08-20: qty 2

## 2016-08-20 MED ORDER — BSS PLUS IO SOLN
INTRAOCULAR | Status: AC
  Filled 2016-08-20: qty 500

## 2016-08-20 MED ORDER — TOBRAMYCIN-DEXAMETHASONE 0.3-0.1 % OP OINT
TOPICAL_OINTMENT | OPHTHALMIC | Status: AC
  Filled 2016-08-20: qty 3.5

## 2016-08-20 MED ORDER — TETRACAINE HCL 0.5 % OP SOLN
OPHTHALMIC | Status: AC
  Filled 2016-08-20: qty 4

## 2016-08-20 MED ORDER — DEXAMETHASONE SODIUM PHOSPHATE 10 MG/ML IJ SOLN
INTRAMUSCULAR | Status: DC | PRN
  Administered 2016-08-20: 10 mg

## 2016-08-20 MED ORDER — ONDANSETRON HCL 4 MG/2ML IJ SOLN
INTRAMUSCULAR | Status: AC
  Filled 2016-08-20: qty 2

## 2016-08-20 MED ORDER — POTASSIUM 99 MG PO TABS: 495.0000 mg | ORAL_TABLET | Freq: Every day | ORAL | Status: DC

## 2016-08-20 MED ORDER — BSS PLUS IO SOLN
INTRAOCULAR | Status: DC | PRN
  Administered 2016-08-20: 1 via INTRAOCULAR

## 2016-08-20 MED ORDER — MIDAZOLAM HCL 2 MG/2ML IJ SOLN
INTRAMUSCULAR | Status: AC
  Filled 2016-08-20: qty 2

## 2016-08-20 MED ORDER — ACETAMINOPHEN 325 MG PO TABS: 325.0000 mg | ORAL_TABLET | ORAL | Status: DC | PRN

## 2016-08-20 MED ORDER — LIDOCAINE 2% (20 MG/ML) 5 ML SYRINGE
INTRAMUSCULAR | Status: AC
  Filled 2016-08-20: qty 10

## 2016-08-20 MED ORDER — PREDNISOLONE ACETATE 1 % OP SUSP
1.0000 [drp] | Freq: Four times a day (QID) | OPHTHALMIC | Status: DC
  Administered 2016-08-21: 1 [drp] via OPHTHALMIC
  Filled 2016-08-20: qty 5

## 2016-08-20 MED ORDER — STERILE WATER FOR INJECTION IJ SOLN
INTRAMUSCULAR | Status: AC
  Filled 2016-08-20: qty 10

## 2016-08-20 MED ORDER — MEPERIDINE HCL 25 MG/ML IJ SOLN: 6.2500 mg | INTRAMUSCULAR | Status: DC | PRN

## 2016-08-20 MED ORDER — SUGAMMADEX SODIUM 200 MG/2ML IV SOLN
INTRAVENOUS | Status: AC
  Filled 2016-08-20: qty 4

## 2016-08-20 MED ORDER — DEXAMETHASONE SODIUM PHOSPHATE 10 MG/ML IJ SOLN
INTRAMUSCULAR | Status: AC
  Filled 2016-08-20: qty 1

## 2016-08-20 MED ORDER — PHENYLEPHRINE HCL 2.5 % OP SOLN
OPHTHALMIC | Status: AC
  Administered 2016-08-20: 1 [drp] via OPHTHALMIC
  Filled 2016-08-20: qty 2

## 2016-08-20 MED ORDER — SUGAMMADEX SODIUM 200 MG/2ML IV SOLN
INTRAVENOUS | Status: AC
  Filled 2016-08-20: qty 2

## 2016-08-20 SURGICAL SUPPLY — 59 items
APL SRG 3 HI ABS STRL LF PLS (MISCELLANEOUS)
APPLICATOR COTTON TIP 6IN STRL (MISCELLANEOUS) ×2 IMPLANT
APPLICATOR DR MATTHEWS STRL (MISCELLANEOUS) IMPLANT
BLADE MVR KNIFE 20G (BLADE) IMPLANT
CANNULA ANT CHAM MAIN (OPHTHALMIC RELATED) IMPLANT
CANNULA VLV SOFT TIP 25G (OPHTHALMIC) ×1 IMPLANT
CANNULA VLV SOFT TIP 25GA (OPHTHALMIC) ×2 IMPLANT
CORDS BIPOLAR (ELECTRODE) IMPLANT
COVER MAYO STAND STRL (DRAPES) ×1 IMPLANT
DRAPE INCISE 51X51 W/FILM STRL (DRAPES) IMPLANT
DRAPE OPHTHALMIC 77X100 STRL (CUSTOM PROCEDURE TRAY) ×2 IMPLANT
FILTER BLUE MILLIPORE (MISCELLANEOUS) IMPLANT
FORCEPS ECKARDT ILM 25G SERR (OPHTHALMIC RELATED) IMPLANT
FORCEPS GRIESHABER ILM 25G A (INSTRUMENTS) IMPLANT
FORCEPS HORIZONTAL 25G DISP (OPHTHALMIC RELATED) IMPLANT
GAS AUTO FILL CONSTEL (OPHTHALMIC)
GAS AUTO FILL CONSTELLATION (OPHTHALMIC) IMPLANT
GLOVE SS BIOGEL STRL SZ 8.5 (GLOVE) ×1 IMPLANT
GLOVE SUPERSENSE BIOGEL SZ 8.5 (GLOVE) ×1
GOWN STRL REUS W/ TWL LRG LVL3 (GOWN DISPOSABLE) ×1 IMPLANT
GOWN STRL REUS W/ TWL XL LVL3 (GOWN DISPOSABLE) ×1 IMPLANT
GOWN STRL REUS W/TWL LRG LVL3 (GOWN DISPOSABLE) ×2
GOWN STRL REUS W/TWL XL LVL3 (GOWN DISPOSABLE) ×2
KIT BASIN OR (CUSTOM PROCEDURE TRAY) ×2 IMPLANT
KNIFE CRESCENT 2.5 55 ANG (BLADE) IMPLANT
LENS BIOM SUPER VIEW SET DISP (OPHTHALMIC RELATED) ×2 IMPLANT
MICROPICK 25G (MISCELLANEOUS)
NDL 18GX1X1/2 (RX/OR ONLY) (NEEDLE) IMPLANT
NDL 25GX 5/8IN NON SAFETY (NEEDLE) IMPLANT
NDL FILTER BLUNT 18X1 1/2 (NEEDLE) IMPLANT
NDL HYPO 25GX1X1/2 BEV (NEEDLE) IMPLANT
NEEDLE 18GX1X1/2 (RX/OR ONLY) (NEEDLE) IMPLANT
NEEDLE 25GX 5/8IN NON SAFETY (NEEDLE) IMPLANT
NEEDLE FILTER BLUNT 18X 1/2SAF (NEEDLE)
NEEDLE FILTER BLUNT 18X1 1/2 (NEEDLE) IMPLANT
NEEDLE HYPO 25GX1X1/2 BEV (NEEDLE) IMPLANT
NS IRRIG 1000ML POUR BTL (IV SOLUTION) ×2 IMPLANT
PACK FRAGMATOME (OPHTHALMIC) IMPLANT
PACK VITRECTOMY CUSTOM (CUSTOM PROCEDURE TRAY) ×2 IMPLANT
PAD ARMBOARD 7.5X6 YLW CONV (MISCELLANEOUS) ×4 IMPLANT
PAK PIK VITRECTOMY CVS 25GA (OPHTHALMIC) ×2 IMPLANT
PENCIL BIPOLAR 25GA STR DISP (OPHTHALMIC RELATED) IMPLANT
PICK MICROPICK 25G (MISCELLANEOUS) IMPLANT
PROBE LASER ILLUM FLEX CVD 25G (OPHTHALMIC) IMPLANT
ROLLS DENTAL (MISCELLANEOUS) IMPLANT
SCRAPER DIAMOND 25GA (OPHTHALMIC RELATED) IMPLANT
SET INJECTOR OIL FLUID CONSTEL (OPHTHALMIC) IMPLANT
STOCKINETTE IMPERVIOUS 9X36 MD (GAUZE/BANDAGES/DRESSINGS) ×4 IMPLANT
STOPCOCK 4 WAY LG BORE MALE ST (IV SETS) IMPLANT
SUT ETHILON 10 0 CS140 6 (SUTURE) IMPLANT
SUT ETHILON 8 0 BV130 4 (SUTURE) IMPLANT
SUT MERSILENE 5 0 RD 1 DA (SUTURE) IMPLANT
SUT PROLENE 10 0 CIF 4 DA (SUTURE) IMPLANT
SUT VICRYL 7 0 TG140 8 (SUTURE) IMPLANT
SYR 30ML SLIP (SYRINGE) IMPLANT
SYR 5ML LL (SYRINGE) IMPLANT
SYR TB 1ML LUER SLIP (SYRINGE) ×1 IMPLANT
WATER STERILE IRR 1000ML POUR (IV SOLUTION) ×2 IMPLANT
WIPE INSTRUMENT VISIWIPE 73X73 (MISCELLANEOUS) IMPLANT

## 2016-08-20 NOTE — Anesthesia Preprocedure Evaluation (Signed)
Anesthesia Evaluation  Patient identified by MRN, date of birth, ID band Patient awake    Reviewed: Allergy & Precautions, NPO status , Patient's Chart, lab work & pertinent test results  Airway Mallampati: II  TM Distance: >3 FB Neck ROM: Full    Dental no notable dental hx.    Pulmonary neg pulmonary ROS,  Sarcoidosis   Pulmonary exam normal breath sounds clear to auscultation       Cardiovascular negative cardio ROS Normal cardiovascular exam Rhythm:Regular Rate:Normal     Neuro/Psych negative neurological ROS  negative psych ROS   GI/Hepatic Neg liver ROS, hiatal hernia, GERD  ,  Endo/Other  Hypothyroidism Morbid obesity  Renal/GU negative Renal ROS  negative genitourinary   Musculoskeletal  (+) Fibromyalgia -  Abdominal   Peds negative pediatric ROS (+)  Hematology negative hematology ROS (+)   Anesthesia Other Findings   Reproductive/Obstetrics negative OB ROS                             Anesthesia Physical Anesthesia Plan  ASA: II  Anesthesia Plan: MAC   Post-op Pain Management:    Induction: Intravenous  PONV Risk Score and Plan: 2 and Ondansetron and Treatment may vary due to age or medical condition  Airway Management Planned: Nasal Cannula  Additional Equipment:   Intra-op Plan:   Post-operative Plan:   Informed Consent: I have reviewed the patients History and Physical, chart, labs and discussed the procedure including the risks, benefits and alternatives for the proposed anesthesia with the patient or authorized representative who has indicated his/her understanding and acceptance.   Dental advisory given  Plan Discussed with: CRNA  Anesthesia Plan Comments:         Anesthesia Quick Evaluation

## 2016-08-20 NOTE — Progress Notes (Signed)
Spoke with Rosey Batheresa from Dr. Ephriam Knucklesankin's office, she was made aware of no pre-op medication orders entered or that the previous orders were not released.

## 2016-08-20 NOTE — H&P (Signed)
Patient has several years and now more recently months history of vision loss in the left eye attributable to dense vitreous debris impacting her ability to read and other activities of daily living. Her visual acuity in the left eye is 20/50 [place]. The left eye has had a lengthy interval to look for spontaneous clearing of this vitreous debris in the opacity. Patient understands this is an attempt to remove the vitreous debris V of it routine vitrectomy of the left eye so as to allow for improved use of visual acuity and improvement of the left eye.  Patient understands risk of anesthesia the rare occurrence death but also the eye including but limited to hemorrhage, infection, scarring, and surgery, loss of vision present disease by intervention in his left eye.  Impression dense vitreous debris, opacity with vision loss left eye  Plan posterior vitrectomy of the left eye under local Mac anesthesia.  Socially patient has no one to stay with her and requested overnight 23 hour observation stay

## 2016-08-20 NOTE — Op Note (Signed)
Preoperative diagnosis: #1 dense vitreous debris, vitreous floaters left eye #2 vision loss left eye affecting activities of daily living  Postoperative diagnoses 1 and 2 the same  Procedure posterior vitrectomy, 25-gauge  Surgeon Edmon CrapeGary a Milon Dethloff M.D.  Anesthesia local retrobulbar with monitored anesthesia control  No complications  Indication for procedure patient complains of inability to read and difficulty with activities of daily living with the dense vitreous debris covering through the visual axis of the left eye.  Patient is a weighted months and even longer this to clear and has not spontaneously.  Risks and benefits of the surgical plan were undertaken. Patient sent the risk of anesthesia and recurrent staph pulses the eye from the underlying condition including not limited to hemorrhage, infection, scarring, need another surgery, no change of vision, loss of vision and progressive disease despite intervention.   Appropriate signed consent was obtained patient taken to the operating room. In the operating room surgical timeout was carried out with staff and surgeon identified left eye is a operative side followed by mild sedation. 2% Xylocaine 5 mL injected retrobulbar with additional 5 mL laterally fashion modified Darel HongVan Lint. Left periocular region sterilely prepped and draped in the usual ophthalmic fashion. Lid speculum applied. 25-gauge trocar placed in the inferotemporal quadrant placement verified visually and let of vitreous cavity and the infusion turned on. Superior trochars applied. Cortectomy was begun. No comp cases occurred. Dense vitreous debris was removed. Vitreous base circumcised through 5660. Anterior hyaloid confirmed to be removed in this pseudophakic patient. This time the intrachamber from the eye.  Trochars removed the wounds are secure. Subconjunctival Decadron applied. Sterile patch and Fox shield applied. Patient taken to the PACU. Patient requests overnight stay  23 hours

## 2016-08-20 NOTE — Anesthesia Procedure Notes (Signed)
Procedure Name: MAC Date/Time: 08/20/2016 12:42 PM Performed by: Salli Quarry Berkley Wrightsman Pre-anesthesia Checklist: Patient identified, Emergency Drugs available, Suction available and Patient being monitored Patient Re-evaluated:Patient Re-evaluated prior to induction Oxygen Delivery Method: Nasal cannula

## 2016-08-20 NOTE — Brief Op Note (Signed)
Preoperative diagnosis: #1 dense vitreous debris, vitreous floaters left eye #2 vision loss left eye affecting activities of daily living  Postoperative diagnoses 1 and 2 the same  Procedure posterior vitrectomy, 25-gauge  Surgeon Edmon CrapeGary a Ezariah Nace M.D.  Anesthesia local retrobulbar with monitored anesthesia control  No complications

## 2016-08-20 NOTE — Anesthesia Postprocedure Evaluation (Signed)
Anesthesia Post Note  Patient: Angela Dudley  Procedure(s) Performed: Procedure(s) (LRB): PARS PLANA VITRECTOMY WITH 25 GAUGE (Left)     Patient location during evaluation: PACU Anesthesia Type: MAC Level of consciousness: awake and alert Pain management: pain level controlled Vital Signs Assessment: post-procedure vital signs reviewed and stable Respiratory status: spontaneous breathing, nonlabored ventilation, respiratory function stable and patient connected to nasal cannula oxygen Cardiovascular status: stable and blood pressure returned to baseline Anesthetic complications: no    Last Vitals:  Vitals:   08/20/16 1350 08/20/16 1403  BP: 140/62 137/79  Pulse: 80 84  Resp: (!) 24 17  Temp:    SpO2: 96% 96%    Last Pain:  Vitals:   08/20/16 1400  TempSrc:   PainSc: 0-No pain                 Montez Hageman

## 2016-08-20 NOTE — Transfer of Care (Signed)
Immediate Anesthesia Transfer of Care Note  Patient: Angela MealyMary B Hammonds  Procedure(s) Performed: Procedure(s): PARS PLANA VITRECTOMY WITH 25 GAUGE (Left)  Patient Location: PACU  Anesthesia Type:General  Level of Consciousness: awake, alert , oriented and patient cooperative  Airway & Oxygen Therapy: Patient Spontanous Breathing  Post-op Assessment: Report given to RN and Post -op Vital signs reviewed and stable  Post vital signs: Reviewed and stable  Last Vitals:  Vitals:   08/20/16 1008 08/20/16 1333  BP: (!) 147/49 (!) 150/69  Pulse: 89 80  Resp: 18 19  Temp: 36.6 C 37 C  SpO2: 93% 93%    Last Pain:  Vitals:   08/20/16 1333  TempSrc:   PainSc: (P) 0-No pain      Patients Stated Pain Goal: 3 (08/20/16 1047)  Complications: No apparent anesthesia complications

## 2016-08-21 ENCOUNTER — Encounter (HOSPITAL_COMMUNITY): Payer: Self-pay | Admitting: Ophthalmology

## 2016-08-21 DIAGNOSIS — H43392 Other vitreous opacities, left eye: Secondary | ICD-10-CM | POA: Diagnosis not present

## 2016-08-21 NOTE — Progress Notes (Signed)
First set of eyedrops administered. Patient instructed to proceed to Dr. Ephriam Knuckles office, where she will receive further discharge instructions.

## 2016-10-01 ENCOUNTER — Other Ambulatory Visit: Payer: Self-pay | Admitting: Orthopedic Surgery

## 2016-10-08 ENCOUNTER — Encounter: Payer: Self-pay | Admitting: Neurology

## 2016-10-08 ENCOUNTER — Ambulatory Visit (INDEPENDENT_AMBULATORY_CARE_PROVIDER_SITE_OTHER): Payer: Medicare Other | Admitting: Neurology

## 2016-10-08 ENCOUNTER — Telehealth: Payer: Self-pay | Admitting: *Deleted

## 2016-10-08 VITALS — BP 128/70 | HR 82 | Ht 62.0 in | Wt 238.0 lb

## 2016-10-08 DIAGNOSIS — R5382 Chronic fatigue, unspecified: Secondary | ICD-10-CM

## 2016-10-08 DIAGNOSIS — G4486 Cervicogenic headache: Secondary | ICD-10-CM

## 2016-10-08 DIAGNOSIS — G9332 Myalgic encephalomyelitis/chronic fatigue syndrome: Secondary | ICD-10-CM

## 2016-10-08 DIAGNOSIS — R51 Headache: Secondary | ICD-10-CM | POA: Diagnosis not present

## 2016-10-08 NOTE — Progress Notes (Signed)
Reason for visit: Cervicogenic headache  Angela Dudley is an 71 y.o. female  History of present illness:  Angela Dudley is a 71 year old left-handed white female with a history of chronic fatigue syndrome and cervicogenic headache. The patient has learned how to cope with a lot of her pain. She indicates that if she avoids strenuous physical activity the headache is not much of a problem. If she works on her computer too long this may also bring on headaches. She may take Tylenol at nighttime, she takes magnesium for her restless leg syndrome. She does have a chronic fatigue syndrome and has increased fatigue if she overdoes any physical activity. The patient will be going for wrist surgery on the right in the next week. Otherwise, no other new medical issues that have come up. The patient returns for an evaluation. She has had MRI of the cervical spine, the results were never sent to our office.  Past Medical History:  Diagnosis Date  . Abdominal pain, generalized   . Allergy   . Anxiety   . Back pain   . Cervicogenic headache 06/08/2016  . Chronic fatigue syndrome   . Cramps, muscle, general   . Degenerative disc disease   . Diverticulosis of colon (without mention of hemorrhage)   . Dizziness   . Esophageal hernia   . Fever   . Fibromyalgia   . Frequent urination at night   . GERD (gastroesophageal reflux disease)   . H/O hiatal hernia   . Headache(784.0)   . Hemorrhoids   . Hyperlipidemia   . Hypothyroidism    no medication- endrocnologist said she never had it  . IBS (irritable bowel syndrome)   . Idiopathic peripheral autonomic neuropathy   . Numbness and tingling    hands, feet, & lips  . Obesity   . Orthostatic hypotension    with prev tilt table test- 08/18/16- "have not passed out in years."  . Osteoarthritis   . Other B-complex deficiencies   . Peptic ulcer disease   . Peritoneal adhesions (postoperative) (postinfection)   . Sarcoidosis   . Sarcoidosis   .  Shortness of breath    exertion  . Thyroid disease    Unspecified hypothyroidism  . Unspecified intestinal obstruction   . UTI (lower urinary tract infection)    hx of  . Vitreous floaters, left   . Wart viral     Past Surgical History:  Procedure Laterality Date  . ANTERIOR CERVICAL DECOMP/DISCECTOMY FUSION N/A 10/16/2013   Procedure: Cervical three-four, Cervical five-six Anterior cervical decompression/diskectomy/fusion;  Surgeon: Barnett Abu, MD;  Location: [facility];  Service: Neurosurgery;  Laterality: N/A;  Cervical three-four, Cervical five-six Anterior cervical decompression/diskectomy/fusion  . ANTERIOR CERVICAL DECOMP/DISCECTOMY FUSION N/A 07/23/2014   Procedure: Cervical four-five Anterior cervical decompression/diskectomy/fusion;  Surgeon: Barnett Abu, MD;  Location: [facility];  Service: Neurosurgery;  Laterality: N/A;  C4-5 Anterior cervical decompression/diskectomy/fusion  . APPENDECTOMY    . BACK SURGERY  2015   herniated disc  . BELPHAROPTOSIS REPAIR Bilateral   . BIOPSY THYROID     benign  . CARPAL TUNNEL RELEASE Bilateral   . CATARACT EXTRACTION Bilateral 1998  . CHOLECYSTECTOMY  2006  . COLON SURGERY  1996   partial colon removal  . COLONOSCOPY W/ POLYPECTOMY    . Doppler arterial studies  05/13/2008   Both carotid systems demonstrate minimal plaque formation with normal flow velocities throughout.  Vertebrals demonstrate antegrade flow bilaterally.  [facility] ECHOCARDIOGRAPHY  2010  Diastolic relaxation abnormality, mild AVSC, mild MR/TR and mild to mod AR  . ESOPHAGOGASTRODUODENOSCOPY    . HERNIA REPAIR  2005   [facility]  . LASIK  1998   Cataract  . LUNG BIOPSY    . LYMPH NODE DISSECTION     sternum  . NASAL SEPTUM SURGERY  2006  . NOSE SURGERY  2006  . Nuclear Test  08/2005   Normal (chest pain)  . PARS PLANA VITRECTOMY Left 08/20/2016  . PARS PLANA VITRECTOMY Left 08/20/2016   Procedure: PARS PLANA VITRECTOMY WITH 25 GAUGE;  Surgeon: Edmon Crape, MD;  Location: [facility];  Service: Ophthalmology;  Laterality: Left;  . PARTIAL COLECTOMY  1996   complicated by an abscess formation  . Right hip bursa removal  1991  . SBO  2002  . TONSILLECTOMY      Family History  Problem Relation Age of Onset  . Crohn's disease Mother   . Heart disease Father   . Hyperlipidemia Neg Hx        No history of early CAD  . Cancer Neg Hx        No history of lymphoma or leukemia    Social history:  reports that she has never smoked. She has never used smokeless tobacco. She reports that she does not drink alcohol or use drugs.    Allergies  Allergen Reactions  . Esomeprazole Magnesium     REACTION: Esophageal spasm  . Other Other (See Comments)    Hepatotoxic Gases per MD request this is not to be used due to Chronic Fatigue Syndrome creates risk for Hepatitis.   Marland Kitchen Percocet [Oxycodone-Acetaminophen]     It "wires me" , I can't sleep  . Amoxicillin Rash    Has patient had a PCN reaction causing immediate rash, facial/tongue/throat swelling, SOB or lightheadedness with hypotension: Yes Has patient had a PCN reaction causing severe rash involving mucus membranes or skin necrosis: No Has patient had a PCN reaction that required hospitalization: No Has patient had a PCN reaction occurring within the last 10 years: No If all of the above answers are "NO", then may proceed with Cephalosporin use.     Medications:  Prior to Admission medications   Medication Sig Start Date End Date Taking? Authorizing Provider  B Complex-C (B-COMPLEX WITH VITAMIN C) tablet Take 1 tablet by mouth daily.   Yes [provider]  Biotin 5000 MCG CAPS Take 5,000 mcg by mouth daily.    Yes [provider]  CALCIUM PO Take 3,000 mg by mouth at bedtime.   Yes [provider]  cetirizine (ZYRTEC) 10 MG tablet Take 10 mg by mouth at bedtime.    Yes [provider]  COCONUT OIL PO Take 2,000 mg by mouth at bedtime.    Yes [provider]  Coenzyme Q10 (COQ10) 200 MG CAPS Take 200 mg by mouth daily.   Yes [provider]  Ginger, Zingiber officinalis, (GINGER PO) Take 1,100 mg by mouth at bedtime.    Yes [provider]  GLUCOSAMINE-CHONDROITIN PO Take 1 tablet by mouth daily.   Yes [provider]  Lecithin 1200 MG CAPS Take 7,200-10,800 mg by mouth at bedtime. 6-9 tablets for pain every night   Yes [provider]  magnesium gluconate (MAGONATE) 500 MG tablet Take 500 mg by mouth daily.    Yes [provider]  Omega-3 Fatty Acids (OMEGA 3 PO) Take 1,360 mg by mouth daily.   Yes  [provider]  Polyethyl Glycol-Propyl Glycol (SYSTANE) 0.4-0.3 % GEL ophthalmic gel Place 1 application into both eyes 2 (two) times daily as needed (dry eyes).   Yes [provider]  Potassium 99 MG TABS Take 495 mg by mouth at bedtime. May take an additional 99-198 mg as needed for electrolytes imbalance   Yes [provider]  potassium chloride SA (K-DUR,KLOR-CON) 20 MEQ tablet Take 1 tablet (20 mEq total) by mouth daily.   Yes Joaquim Nam, MD  Probiotic Product (VSL#3) CAPS Take 1 capsule by mouth daily.   Yes [provider]  Selenium 200 MCG CAPS Take 200 mcg by mouth daily.    Yes [provider]  TURMERIC CURCUMIN PO Take 1,600 mg by mouth at bedtime.    Yes [provider]  UNABLE TO FIND Take 1 Dose by mouth daily. Med Name: Vital reds   Yes [provider]  zinc gluconate 50 MG tablet Take 50 mg by mouth daily.     Yes [provider]    ROS:  Out of a complete 14 system review of symptoms, the patient complains only of the following symptoms, and all other reviewed systems are negative.  Fatigue Cough Restless legs, insomnia, daytime sleepiness Frequency of urination, urinary urgency Joint pain, joint swelling, back pain, muscle cramps, neck pain, neck stiffness Bruising easily Headache,  numbness  Blood pressure 128/70, pulse 82, height  (1.575 m), weight 238 lb (108 kg), SpO2 95 %.  Physical Exam  General: The patient is alert and cooperative at the time of the examination. The patient is markedly obese.  Neuromuscular: The patient lacks about 25 of full lateral rotation of the cervical spine bilaterally.  Skin: No significant peripheral edema is noted.   Neurologic Exam  Mental status: The patient is alert and oriented x 3 at the time of the examination. The patient has apparent normal recent and remote memory, with an apparently normal attention span and concentration ability.   Cranial nerves: Facial symmetry is present. Speech is normal, no aphasia or dysarthria is noted. Extraocular movements are full. Visual fields are full.  Motor: The patient has good strength in all 4 extremities.  Sensory examination: Soft touch sensation is symmetric on the face, arms, and legs.  Coordination: The patient has good finger-nose-finger and heel-to-shin bilaterally.  Gait and station: The patient has a normal gait. Tandem gait is unsteady. Romberg is negative. No drift is seen.  Reflexes: Deep tendon reflexes are symmetric.   Assessment/Plan:  1. Cervicogenic headache  2. Chronic fatigue syndrome  The patient has learned how to cope with the cervicogenic headache. She indicates that cortisone injections previously had increased headache. We may consider physical therapy if her symptoms worsen in the future and do not abate. She is very sensitive to multiple medications. She will follow-up through this office on an as-needed basis.  Marlan Palau MD 10/08/2016 11:47 AM  [facility] [address] [place], [place] [address]  Phone [phone] Fax [phone]

## 2016-10-08 NOTE — Telephone Encounter (Signed)
Called [facility] at [phone] and LVM for [facility]. Requested MRI report from 06/08/16 faxed to [phone].  Gave [facility] phone number if she has further questions

## 2016-10-13 ENCOUNTER — Telehealth: Payer: Self-pay | Admitting: Neurology

## 2016-10-13 NOTE — Telephone Encounter (Signed)
Called and spoke with [employer]/gallery exhibitions officer) and advised I requested MRI report last week, have not gotten it yet. She stated she will send report herself to [phone].

## 2016-10-13 NOTE — Telephone Encounter (Signed)
Received report via fax. Gave to CW,MD for review.  

## 2016-10-13 NOTE — Telephone Encounter (Signed)
MRI brain 06/10/16:  MRI of the brain shows no acute or reversible intracranial process. 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  I called patient. The MRI shows very minimal white matter changes. Nothing that would explain significant headache issues. I discussed this with her.

## 2016-10-14 ENCOUNTER — Other Ambulatory Visit (HOSPITAL_COMMUNITY): Payer: Self-pay | Admitting: *Deleted

## 2016-10-14 NOTE — Pre-Procedure Instructions (Signed)
MACKENSIE PILSON  10/14/2016    Your procedure is scheduled on Monday, October 19, 2016 at 9:30 AM.   Report to [facility] Entrance "A" Admitting Office at 7:30 AM.   Call this number if you have problems the morning of surgery: [phone]   Questions prior to day of surgery, please call [phone] between 8 & 4 PM.   Remember:  Do not eat food or drink liquids after midnight Sunday, 10/18/16.  Take these medicines the morning of surgery with A SIP OF WATER: Tylenol - if needed  Stop all Herbal medications and Fish Oil as of today.   Do not wear jewelry, make-up or nail polish.  Do not wear lotions, powders, perfumes or deodorant.  Do not shave 48 hours prior to surgery.   Do not bring valuables to the hospital.  [facility] is not responsible for any belongings or valuables.  Contacts, dentures or bridgework may not be worn into surgery.  Leave your suitcase in the car.  After surgery it may be brought to your room.  For patients admitted to the hospital, discharge time will be determined by your treatment team.  Patients discharged the day of surgery will not be allowed to drive home.   [facility] - Preparing for Surgery  Before surgery, you can play an important role.  Because skin is not sterile, your skin needs to be as free of germs as possible.  You can reduce the number of germs on you skin by washing with CHG (chlorahexidine gluconate) soap before surgery.  CHG is an antiseptic cleaner which kills germs and bonds with the skin to continue killing germs even after washing.  Please DO NOT use if you have an allergy to CHG or antibacterial soaps.  If your skin becomes reddened/irritated stop using the CHG and inform your nurse when you arrive at Short Stay.  Do not shave (including legs and underarms) for at least 48 hours prior to the first CHG shower.  You may shave your face.  Please follow these instructions carefully:   1.  Shower with CHG Soap the  night before surgery and the                    morning of Surgery.  2.  If you choose to wash your hair, wash your hair first as usual with your       normal shampoo.  3.  After you shampoo, rinse your hair and body thoroughly to remove the shampoo.  4.  Use CHG as you would any other liquid soap.  You can apply chg directly       to the skin and wash gently with scrungie or a clean washcloth.  5.  Apply the CHG Soap to your body ONLY FROM THE NECK DOWN.        Do not use on open wounds or open sores.  Avoid contact with your eyes, ears, mouth and genitals (private parts).  Wash genitals (private parts) with your normal soap.  6.  Wash thoroughly, paying special attention to the area where your surgery        will be performed.  7.  Thoroughly rinse your body with warm water from the neck down.  8.  DO NOT shower/wash with your normal soap after using and rinsing off       the CHG Soap.  9.  Pat yourself dry with a clean towel.  10.  Wear clean pajamas.            11.  Place clean sheets on your bed the night of your first shower and do not        sleep with pets.  Day of Surgery  Do not apply any lotions/deodorants the morning of surgery.  Please wear clean clothes to the hospital.   Please read over the fact sheets that you were given.

## 2016-10-15 ENCOUNTER — Encounter (HOSPITAL_COMMUNITY)
Admission: RE | Admit: 2016-10-15 | Discharge: 2016-10-15 | Disposition: A | Discharge status: Home / Self Care | Payer: Medicare Other | Source: Ambulatory Visit | Attending: Orthopedic Surgery | Admitting: Orthopedic Surgery

## 2016-10-15 ENCOUNTER — Encounter (HOSPITAL_COMMUNITY): Payer: Self-pay

## 2016-10-15 DIAGNOSIS — Z01818 Encounter for other preprocedural examination: Secondary | ICD-10-CM | POA: Insufficient documentation

## 2016-10-15 HISTORY — DX: Anemia, unspecified: D64.9

## 2016-10-15 NOTE — Progress Notes (Addendum)
Pt denies cardiac history, chest pain or sob. Pt states that Dr. Mina Marble was doing a trigger release of her long finger on her right hand along with her thumb and wrist. Consent form does not have the long finger on it. Pt is very upset that surgery time was moved earlier than what she was told. She states emphatically that she cannot get here at 7:30 because she can't drive in the dark. She was told her surgery was at 10:00 AM and she would arrive at 8:30 AM. I called Alisha at Dr. Ronie Spies office and she stated that she didn't think she could move it back, but would check with Dr. Mina Marble and would let pt know something tomorrow. Made Alisha aware that consent order needs to be changed if pt is to have her long finger released. Pt states that she has no one to drive her here. Pt also stated that she would not keep her dogs from sleeping in her bed the night before surgery. Pt will use the wipes the morning of surgery. If pt's surgery is Monday, the 15th, she will need her blood drawn day of surgery.

## 2016-10-16 MED ORDER — VANCOMYCIN HCL 10 G IV SOLR
1500.0000 mg | INTRAVENOUS | Status: AC
  Administered 2016-10-19: 1500 mg via INTRAVENOUS
  Filled 2016-10-16: qty 1500

## 2016-10-19 ENCOUNTER — Ambulatory Visit (HOSPITAL_COMMUNITY): Payer: Medicare Other | Admitting: Anesthesiology

## 2016-10-19 ENCOUNTER — Encounter (HOSPITAL_COMMUNITY)
Admission: RE | Disposition: A | Discharge status: Home / Self Care | Payer: Self-pay | Source: Ambulatory Visit | Attending: Orthopedic Surgery

## 2016-10-19 ENCOUNTER — Ambulatory Visit (HOSPITAL_COMMUNITY)
Admission: RE | Admit: 2016-10-19 | Discharge: 2016-10-20 | Disposition: A | Discharge status: Home / Self Care | Payer: Medicare Other | Source: Ambulatory Visit | Attending: Orthopedic Surgery | Admitting: Orthopedic Surgery

## 2016-10-19 ENCOUNTER — Encounter (HOSPITAL_COMMUNITY): Payer: Self-pay | Admitting: Anesthesiology

## 2016-10-19 DIAGNOSIS — M65311 Trigger thumb, right thumb: Secondary | ICD-10-CM | POA: Diagnosis not present

## 2016-10-19 DIAGNOSIS — Z885 Allergy status to narcotic agent status: Secondary | ICD-10-CM | POA: Insufficient documentation

## 2016-10-19 DIAGNOSIS — Z880 Allergy status to penicillin: Secondary | ICD-10-CM | POA: Diagnosis not present

## 2016-10-19 DIAGNOSIS — M797 Fibromyalgia: Secondary | ICD-10-CM | POA: Insufficient documentation

## 2016-10-19 DIAGNOSIS — Z79899 Other long term (current) drug therapy: Secondary | ICD-10-CM | POA: Diagnosis not present

## 2016-10-19 DIAGNOSIS — K219 Gastro-esophageal reflux disease without esophagitis: Secondary | ICD-10-CM | POA: Diagnosis not present

## 2016-10-19 DIAGNOSIS — E039 Hypothyroidism, unspecified: Secondary | ICD-10-CM | POA: Diagnosis not present

## 2016-10-19 DIAGNOSIS — M654 Radial styloid tenosynovitis [de Quervain]: Secondary | ICD-10-CM | POA: Insufficient documentation

## 2016-10-19 HISTORY — PX: MASS EXCISION: SHX2000

## 2016-10-19 HISTORY — PX: TRIGGER FINGER RELEASE: SHX641

## 2016-10-19 HISTORY — PX: TENOLYSIS: SHX396

## 2016-10-19 LAB — CBC
HEMATOCRIT: 43.8 % (ref 36.0–46.0)
Hemoglobin: 14.3 g/dL (ref 12.0–15.0)
MCH: 29.7 pg (ref 26.0–34.0)
MCHC: 32.6 g/dL (ref 30.0–36.0)
MCV: 91.1 fL (ref 78.0–100.0)
Platelets: 208 10*3/uL (ref 150–400)
RBC: 4.81 MIL/uL (ref 3.87–5.11)
RDW: 13.7 % (ref 11.5–15.5)
WBC: 6.8 10*3/uL (ref 4.0–10.5)

## 2016-10-19 SURGERY — INCISION, TENDON SHEATH
Anesthesia: General | Laterality: Right

## 2016-10-19 MED ORDER — CHLORHEXIDINE GLUCONATE 4 % EX LIQD: 60.0000 mL | Freq: Once | CUTANEOUS | Status: DC

## 2016-10-19 MED ORDER — PROPOFOL 1000 MG/100ML IV EMUL
INTRAVENOUS | Status: AC
  Filled 2016-10-19: qty 100

## 2016-10-19 MED ORDER — BUPIVACAINE HCL (PF) 0.25 % IJ SOLN
INTRAMUSCULAR | Status: AC
  Filled 2016-10-19: qty 30

## 2016-10-19 MED ORDER — FENTANYL CITRATE (PF) 250 MCG/5ML IJ SOLN
INTRAMUSCULAR | Status: AC
  Filled 2016-10-19: qty 5

## 2016-10-19 MED ORDER — MIDAZOLAM HCL 2 MG/2ML IJ SOLN
INTRAMUSCULAR | Status: AC
  Filled 2016-10-19: qty 2

## 2016-10-19 MED ORDER — BUPIVACAINE HCL (PF) 0.25 % IJ SOLN
INTRAMUSCULAR | Status: DC | PRN
  Administered 2016-10-19: 30 mL

## 2016-10-19 MED ORDER — SODIUM CHLORIDE 0.9 % IV SOLN: INTRAVENOUS | Status: DC

## 2016-10-19 MED ORDER — ACETAMINOPHEN 500 MG PO TABS: 1000.0000 mg | ORAL_TABLET | Freq: Four times a day (QID) | ORAL | Status: DC | PRN

## 2016-10-19 MED ORDER — MEPERIDINE HCL 25 MG/ML IJ SOLN: 6.2500 mg | INTRAMUSCULAR | Status: DC | PRN

## 2016-10-19 MED ORDER — FENTANYL CITRATE (PF) 100 MCG/2ML IJ SOLN
INTRAMUSCULAR | Status: DC | PRN
  Administered 2016-10-19 (×3): 50 ug via INTRAVENOUS

## 2016-10-19 MED ORDER — PROPOFOL 10 MG/ML IV BOLUS
INTRAVENOUS | Status: AC
  Filled 2016-10-19: qty 20

## 2016-10-19 MED ORDER — LACTATED RINGERS IV SOLN
INTRAVENOUS | Status: DC
  Administered 2016-10-19 (×2): via INTRAVENOUS

## 2016-10-19 MED ORDER — LIDOCAINE HCL (CARDIAC) 20 MG/ML IV SOLN
INTRAVENOUS | Status: DC | PRN
  Administered 2016-10-19: 100 mg via INTRAVENOUS

## 2016-10-19 MED ORDER — SCOPOLAMINE 1 MG/3DAYS TD PT72
MEDICATED_PATCH | TRANSDERMAL | Status: AC
  Filled 2016-10-19: qty 1

## 2016-10-19 MED ORDER — ONDANSETRON HCL 4 MG/2ML IJ SOLN
INTRAMUSCULAR | Status: AC
  Filled 2016-10-19: qty 2

## 2016-10-19 MED ORDER — LIDOCAINE 2% (20 MG/ML) 5 ML SYRINGE
INTRAMUSCULAR | Status: AC
  Filled 2016-10-19: qty 5

## 2016-10-19 MED ORDER — PROPOFOL 10 MG/ML IV BOLUS
INTRAVENOUS | Status: DC | PRN
  Administered 2016-10-19: 180 mg via INTRAVENOUS

## 2016-10-19 MED ORDER — 0.9 % SODIUM CHLORIDE (POUR BTL) OPTIME
TOPICAL | Status: DC | PRN
  Administered 2016-10-19: 1000 mL

## 2016-10-19 MED ORDER — ONDANSETRON HCL 4 MG/2ML IJ SOLN
INTRAMUSCULAR | Status: DC | PRN
  Administered 2016-10-19: 4 mg via INTRAVENOUS

## 2016-10-19 MED ORDER — BETAMETHASONE SOD PHOS & ACET 6 (3-3) MG/ML IJ SUSP: 12.0000 mg | INTRAMUSCULAR | Status: AC

## 2016-10-19 MED ORDER — METOCLOPRAMIDE HCL 5 MG/ML IJ SOLN: 10.0000 mg | Freq: Once | INTRAMUSCULAR | Status: DC | PRN

## 2016-10-19 MED ORDER — FENTANYL CITRATE (PF) 100 MCG/2ML IJ SOLN: 25.0000 ug | INTRAMUSCULAR | Status: DC | PRN

## 2016-10-19 MED ORDER — MIDAZOLAM HCL 5 MG/5ML IJ SOLN
INTRAMUSCULAR | Status: DC | PRN
  Administered 2016-10-19: 1 mg via INTRAVENOUS

## 2016-10-19 SURGICAL SUPPLY — 44 items
APL SKNCLS STERI-STRIP NONHPOA (GAUZE/BANDAGES/DRESSINGS) ×1
BANDAGE ACE 3X5.8 VEL STRL LF (GAUZE/BANDAGES/DRESSINGS) ×2 IMPLANT
BANDAGE ACE 4X5 VEL STRL LF (GAUZE/BANDAGES/DRESSINGS) ×1 IMPLANT
BENZOIN TINCTURE PRP APPL 2/3 (GAUZE/BANDAGES/DRESSINGS) ×1 IMPLANT
BNDG CMPR 9X4 STRL LF SNTH (GAUZE/BANDAGES/DRESSINGS) ×1
BNDG ESMARK 4X9 LF (GAUZE/BANDAGES/DRESSINGS) ×2 IMPLANT
BNDG GAUZE ELAST 4 BULKY (GAUZE/BANDAGES/DRESSINGS) ×2 IMPLANT
CORDS BIPOLAR (ELECTRODE) ×2 IMPLANT
COVER MAYO STAND STRL (DRAPES) IMPLANT
COVER SURGICAL LIGHT HANDLE (MISCELLANEOUS) ×2 IMPLANT
CUFF TOURNIQUET SINGLE 18IN (TOURNIQUET CUFF) ×1 IMPLANT
CUFF TOURNIQUET SINGLE 24IN (TOURNIQUET CUFF) IMPLANT
DRAPE SURG 17X23 STRL (DRAPES) ×2 IMPLANT
DURAPREP 26ML APPLICATOR (WOUND CARE) ×2 IMPLANT
GAUZE SPONGE 4X4 12PLY STRL (GAUZE/BANDAGES/DRESSINGS) ×2 IMPLANT
GAUZE SPONGE 4X4 12PLY STRL LF (GAUZE/BANDAGES/DRESSINGS) ×1 IMPLANT
GAUZE XEROFORM 1X8 LF (GAUZE/BANDAGES/DRESSINGS) ×2 IMPLANT
GLOVE SURG SYN 8.0 (GLOVE) ×2 IMPLANT
GLOVE SURG SYN 8.0 PF PI (GLOVE) ×1 IMPLANT
GOWN STRL REUS W/ TWL LRG LVL3 (GOWN DISPOSABLE) ×1 IMPLANT
GOWN STRL REUS W/ TWL XL LVL3 (GOWN DISPOSABLE) ×1 IMPLANT
GOWN STRL REUS W/TWL LRG LVL3 (GOWN DISPOSABLE) ×2
GOWN STRL REUS W/TWL XL LVL3 (GOWN DISPOSABLE) ×2
KIT BASIN OR (CUSTOM PROCEDURE TRAY) ×2 IMPLANT
KIT ROOM TURNOVER OR (KITS) ×2 IMPLANT
NDL HYPO 25GX1X1/2 BEV (NEEDLE) IMPLANT
NEEDLE HYPO 25GX1X1/2 BEV (NEEDLE) ×2 IMPLANT
NS IRRIG 1000ML POUR BTL (IV SOLUTION) ×2 IMPLANT
PACK ORTHO EXTREMITY (CUSTOM PROCEDURE TRAY) ×2 IMPLANT
PAD ARMBOARD 7.5X6 YLW CONV (MISCELLANEOUS) ×4 IMPLANT
PAD CAST 4YDX4 CTTN HI CHSV (CAST SUPPLIES) ×1 IMPLANT
PADDING CAST COTTON 4X4 STRL (CAST SUPPLIES) ×2
PADDING CAST COTTON 6X4 STRL (CAST SUPPLIES) ×1 IMPLANT
STRIP CLOSURE SKIN 1/2X4 (GAUZE/BANDAGES/DRESSINGS) ×2 IMPLANT
SUT ETHILON 4 0 PS 2 18 (SUTURE) IMPLANT
SUT PROLENE 3 0 PS 1 (SUTURE) ×1 IMPLANT
SUT PROLENE 3 0 PS 2 (SUTURE) ×1 IMPLANT
SUT VIC AB 3-0 PS2 18 (SUTURE)
SUT VIC AB 3-0 PS2 18XBRD (SUTURE) IMPLANT
SYR CONTROL 10ML LL (SYRINGE) ×1 IMPLANT
TOWEL OR 17X24 6PK STRL BLUE (TOWEL DISPOSABLE) ×1 IMPLANT
TOWEL OR 17X26 10 PK STRL BLUE (TOWEL DISPOSABLE) ×2 IMPLANT
UNDERPAD 30X30 (UNDERPADS AND DIAPERS) ×2 IMPLANT
WATER STERILE IRR 1000ML POUR (IV SOLUTION) ×2 IMPLANT

## 2016-10-19 NOTE — Anesthesia Postprocedure Evaluation (Signed)
Anesthesia Post Note  Patient: Angela Dudley  Procedure(s) Performed: RIGHT WRIST STENOSING TENOSYNOVITIS RELEASE  (Right ) RIGHT THUMB RELEASE TRIGGER FINGER/A-1 PULLEY (Right ) EXCISION DUBUYTRENS NODULE RIGHT MIDDLE FINGER (Right )     Patient location during evaluation: PACU Anesthesia Type: General Level of consciousness: awake and alert Pain management: pain level controlled Vital Signs Assessment: post-procedure vital signs reviewed and stable Respiratory status: spontaneous breathing, nonlabored ventilation, respiratory function stable and patient connected to nasal cannula oxygen Cardiovascular status: blood pressure returned to baseline and stable Postop Assessment: no apparent nausea or vomiting Anesthetic complications: no    Last Vitals:  Vitals:   10/19/16 1044 10/19/16 1100  BP: (!) 122/57 (!) 141/64  Pulse: 84 82  Resp: 15 16  Temp: 36.5 C 36.7 C  SpO2: 92% 95%    Last Pain:  Vitals:   10/19/16 1010  TempSrc:   PainSc: 0-No pain                 Phillips Grout

## 2016-10-19 NOTE — H&P (Signed)
Angela Dudley is an 71 y.o. female.   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 Abdominal pain, generalized   . Allergy   . Anemia   . Anxiety    pt denies  . Back pain   . Cervicogenic headache 06/08/2016  . Chronic fatigue syndrome   . Cramps, muscle, general   . Degenerative disc disease   . Diverticulosis of colon (without mention of hemorrhage)   . Dizziness   . Esophageal hernia   . Fever   . Fibromyalgia   . Frequent urination at night   . GERD (gastroesophageal reflux disease)   . H/O hiatal hernia   . Headache(784.0)   . Hemorrhoids   . Hyperlipidemia   . Hypothyroidism    no medication- endrocnologist said she never had it  . IBS (irritable bowel syndrome)   . Idiopathic peripheral autonomic neuropathy   . Numbness and tingling    hands, feet, & lips  . Obesity   . Orthostatic hypotension    with prev tilt table test- 08/18/16- "have not passed out in years."  . Orthostatic hypotension   . Osteoarthritis   . Other B-complex deficiencies   . Peptic ulcer disease   . Peritoneal adhesions (postoperative) (postinfection)   . Sarcoidosis   . Sarcoidosis   . Shortness of breath    exertion- due to chronic fatigue syndrome, sarcoid  . Thyroid disease    Unspecified hypothyroidism  . Unspecified intestinal obstruction   . UTI (lower urinary tract infection)    hx of  . Vitreous floaters, left   . Wart viral     Past Surgical History:  Procedure Laterality Date  . ANTERIOR CERVICAL DECOMP/DISCECTOMY FUSION N/A 10/16/2013   Procedure: Cervical three-four, Cervical five-six Anterior cervical decompression/diskectomy/fusion;  Surgeon: Barnett Abu, MD;  Location: [facility];  Service: Neurosurgery;  Laterality: N/A;  Cervical three-four, Cervical five-six Anterior cervical decompression/diskectomy/fusion  . ANTERIOR CERVICAL DECOMP/DISCECTOMY FUSION N/A 07/23/2014   Procedure: Cervical four-five Anterior cervical decompression/diskectomy/fusion;  Surgeon: Barnett Abu, MD;  Location: [facility];  Service: Neurosurgery;  Laterality: N/A;  C4-5 Anterior cervical decompression/diskectomy/fusion  . APPENDECTOMY    . BACK SURGERY  2015   herniated disc - cervical  . BELPHAROPTOSIS REPAIR Bilateral   . BIOPSY THYROID     benign  . CARPAL TUNNEL RELEASE Bilateral   . CATARACT EXTRACTION Bilateral 1998  . CHOLECYSTECTOMY  2006  . COLON SURGERY  1996   partial colon removal  . COLONOSCOPY W/ POLYPECTOMY    . Doppler arterial studies  05/13/2008   Both carotid systems demonstrate minimal plaque formation with normal flow velocities throughout.  Vertebrals demonstrate antegrade flow bilaterally.  . [facility] ECHOCARDIOGRAPHY  2010   Diastolic relaxation abnormality, mild AVSC, mild MR/TR and mild to mod AR  . ESOPHAGOGASTRODUODENOSCOPY    . HERNIA REPAIR  2005   [facility]  . LASIK  1998   Cataract  . LUNG BIOPSY    . LYMPH NODE DISSECTION     sternum  . NASAL SEPTUM SURGERY  2006  . NOSE SURGERY  2006  . Nuclear Test  08/2005   Normal (chest pain)  . PARS PLANA VITRECTOMY Left 08/20/2016  . PARS PLANA VITRECTOMY  Left 08/20/2016   Procedure: PARS PLANA VITRECTOMY WITH 25 GAUGE;  Surgeon: Edmon Crape, MD;  Location: [facility] OR;  Service: Ophthalmology;  Laterality: Left;  . PARTIAL COLECTOMY  1996   complicated by an abscess formation  . Right hip bursa removal  1991  . SBO  2002  . TONSILLECTOMY      Family History  Problem Relation Age of Onset  . Crohn's disease Mother   . Heart disease Father   . Hyperlipidemia Neg Hx        No history of early CAD  . Cancer Neg Hx        No history of lymphoma or leukemia   Social  History:  reports that she has never smoked. She has never used smokeless tobacco. She reports that she does not drink alcohol or use drugs.  Allergies:  Allergies  Allergen Reactions  . Esomeprazole Magnesium Other (See Comments)    REACTION: Esophageal spasm  . Other Other (See Comments)    Hepatotoxic Gases per MD request this is not to be used due to Chronic Fatigue Syndrome creates risk for Hepatitis.   Marland Kitchen Percocet [Oxycodone-Acetaminophen] Other (See Comments)    It "wires me" , I can't sleep  . Amoxicillin Rash and Other (See Comments)    Has patient had a PCN reaction causing immediate rash, facial/tongue/throat swelling, SOB or lightheadedness with hypotension: Yes Has patient had a PCN reaction causing severe rash involving mucus membranes or skin necrosis: No Has patient had a PCN reaction that required hospitalization: No Has patient had a PCN reaction occurring within the last 10 years: No If all of the above answers are "NO", then may proceed with Cephalosporin use.     Medications Prior to Admission  Medication Sig Dispense Refill  . acetaminophen (TYLENOL) 500 MG tablet Take 1,000 mg by mouth every 6 (six) hours as needed for moderate pain or headache.    . B Complex-C (B-COMPLEX WITH VITAMIN C) tablet Take 1 tablet by mouth daily.    . Biotin 5000 MCG CAPS Take 5,000 mcg by mouth daily.     Marland Kitchen CALCIUM PO Take 2,000 mg by mouth at bedtime.     . cetirizine (ZYRTEC) 10 MG tablet Take 10 mg by mouth every evening.     Marland Kitchen COCONUT OIL PO Take 2,000 mg by mouth at bedtime.     . Coenzyme Q10 (COQ10) 200 MG CAPS Take 200 mg by mouth daily.    . Ginger, Zingiber officinalis, (GINGER PO) Take 1,100 mg by mouth at bedtime.     Marland Kitchen GLUCOSAMINE-CHONDROITIN PO Take 1 tablet by mouth daily.    . Lecithin 1200 MG CAPS Take 7,200-10,800 mg by mouth at bedtime as needed (for pain).     . magnesium oxide (MAG-OX) 400 MG tablet Take 400 mg by mouth daily.    . Omega-3 Fatty Acids (OMEGA 3  PO) Take 1,360 mg by mouth daily.    Bertram Gala Glycol-Propyl Glycol (SYSTANE) 0.4-0.3 % GEL ophthalmic gel Place 1 application into both eyes 2 (two) times daily as needed (dry eyes).    . Potassium 99 MG TABS Take 495 mg by mouth at bedtime as needed (for additional potassium). May take an additional 99-198 mg as needed for electrolytes imbalance     . potassium chloride SA (K-DUR,KLOR-CON) 20 MEQ tablet Take 1 tablet (20 mEq total) by mouth daily. 90 tablet 1  . Probiotic Product (VSL#3) CAPS Take 1 capsule by mouth daily.    Marland Kitchen  Selenium 200 MCG CAPS Take 200 mcg by mouth daily.     . TURMERIC CURCUMIN PO Take 1,600 mg by mouth at bedtime.     Marland Kitchen UNABLE TO FIND Take 1 capsule by mouth daily. Med Name: Vital reds     . zinc gluconate 50 MG tablet Take 50 mg by mouth daily.        No results found for this or any previous visit (from the past 48 hour(s)). No results found.  Review of Systems  All other systems reviewed and are negative.   Blood pressure (!) 163/64, pulse 88, temperature 97.8 F (36.6 C), temperature source Oral, resp. rate 18, SpO2 92 %. Physical Exam  Constitutional: She is oriented to person, place, and time. She appears well-developed and well-nourished.  HENT:  Head: Normocephalic and atraumatic.  Neck: Normal range of motion.  Cardiovascular: Normal rate.   Respiratory: Effort normal.  Musculoskeletal:       Right wrist: She exhibits tenderness and swelling.       Right hand: She exhibits tenderness and swelling.  Right thumb palpable mass at MP flexion crease and IP joint triggering as well as right wrist first dorsal compartment pain and swelling with positive Finkelstein's test  Neurological: She is alert and oriented to person, place, and time.  Skin: Skin is warm.  Psychiatric: She has a normal mood and affect. Her behavior is normal. Judgment and thought content normal.     Assessment/Plan Patient's a very pleasant 71 year old female with persistent  right wrist first dorsal compartment tenosynovitis and right thumb triggering despite conservative measures. Plan on release of right thumb A1 pulley and right wrist first dorsal compartment release and tenosynovectomy.  Marlowe Shores, MD 10/19/2016, 8:49 AM

## 2016-10-19 NOTE — Transfer of Care (Signed)
Immediate Anesthesia Transfer of Care Note  Patient: Angela Dudley  Procedure(s) Performed: RIGHT WRIST STENOSING TENOSYNOVITIS RELEASE  (Right ) RIGHT THUMB RELEASE TRIGGER FINGER/A-1 PULLEY (Right ) EXCISION DUBUYTRENS NODULE RIGHT MIDDLE FINGER (Right )  Patient Location: PACU  Anesthesia Type:General  Level of Consciousness: awake, alert , oriented and patient cooperative  Airway & Oxygen Therapy: Patient Spontanous Breathing  Post-op Assessment: Report given to RN, Post -op Vital signs reviewed and stable and Patient moving all extremities  Post vital signs: Reviewed and stable  Last Vitals:  Vitals:   10/19/16 0836 10/19/16 1010  BP: (!) 163/64 131/60  Pulse: 88 96  Resp: 18 18  Temp: 36.6 C 36.4 C  SpO2: 92% 93%    Last Pain:  Vitals:   10/19/16 0836  TempSrc: Oral      Patients Stated Pain Goal: 8 (10/19/16 0907)  Complications: No apparent anesthesia complications

## 2016-10-19 NOTE — Anesthesia Preprocedure Evaluation (Signed)
Anesthesia Evaluation  Patient identified by MRN, date of birth, ID band Patient awake    Reviewed: Allergy & Precautions, NPO status , Patient's Chart, lab work & pertinent test results  Airway Mallampati: II  TM Distance: >3 FB Neck ROM: Full    Dental no notable dental hx.    Pulmonary neg pulmonary ROS,  Sarcoidosis   Pulmonary exam normal breath sounds clear to auscultation       Cardiovascular negative cardio ROS Normal cardiovascular exam Rhythm:Regular Rate:Normal     Neuro/Psych negative neurological ROS  negative psych ROS   GI/Hepatic Neg liver ROS, hiatal hernia, GERD  Controlled and Medicated,  Endo/Other  Hypothyroidism Morbid obesity  Renal/GU negative Renal ROS  negative genitourinary   Musculoskeletal  (+) Fibromyalgia -  Abdominal   Peds negative pediatric ROS (+)  Hematology negative hematology ROS (+)   Anesthesia Other Findings   Reproductive/Obstetrics negative OB ROS                             Anesthesia Physical  Anesthesia Plan  ASA: II  Anesthesia Plan: General   Post-op Pain Management:    Induction: Intravenous  PONV Risk Score and Plan: 2 and Ondansetron and Treatment may vary due to age or medical condition  Airway Management Planned: LMA  Additional Equipment:   Intra-op Plan:   Post-operative Plan:   Informed Consent: I have reviewed the patients History and Physical, chart, labs and discussed the procedure including the risks, benefits and alternatives for the proposed anesthesia with the patient or authorized representative who has indicated his/her understanding and acceptance.   Dental advisory given  Plan Discussed with: CRNA  Anesthesia Plan Comments:         Anesthesia Quick Evaluation

## 2016-10-19 NOTE — Op Note (Signed)
Please see dictated note #[id number]

## 2016-10-19 NOTE — Anesthesia Procedure Notes (Signed)
Procedure Name: LMA Insertion Date/Time: 10/19/2016 9:33 AM Performed by: Orlinda Blalock, Clearance Chenault L Pre-anesthesia Checklist: Patient identified, Emergency Drugs available, Suction available and Patient being monitored Patient Re-evaluated:Patient Re-evaluated prior to induction Oxygen Delivery Method: Circle System Utilized Preoxygenation: Pre-oxygenation with 100% oxygen Induction Type: IV induction Ventilation: Mask ventilation without difficulty LMA: LMA inserted LMA Size: 4.0 and 5.0 Number of attempts: 1 Placement Confirmation: positive ETCO2 Tube secured with: Tape Dental Injury: Teeth and Oropharynx as per pre-operative assessment

## 2016-10-19 NOTE — Op Note (Signed)
NAME:  Angela Dudley, Angela Dudley                     ACCOUNT NO.:  MEDICAL RECORD NO.:  [id number]  LOCATION:                                 FACILITY:  PHYSICIAN:  Artist Pais. Mina Marble, M.D.   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  PROCEDURE:  Right wrist first dorsal compartment release and right thumb A1 pulley release.  SURGEON:  Artist Pais. Mina Marble, M.D.  ASSISTANT:  None.  ANESTHESIA:  General.  COMPLICATION:  No complication.  DRAINS:  No drains.  DESCRIPTION OF PROCEDURE:  The patient was taken to the operating suite. After induction of adequate general anesthetic, right upper extremity was prepped and draped in sterile fashion.  An Esmarch was used to exsanguinate the limb.  Tourniquet was inflated to 250 mmHg.  At this point in time, incision was made over the MP flexion crease of the right thumb.  Skin was incised transversely 2 cm.  Dissection was carried down the flexor sheath.  The neurovascular bundle was identified and retracted.  The A1 pulley was split.  The FPL tendon was lysed of all adhesions.  This wound was irrigated and loosely closed with 3-0 Prolene in 3 horizontal mattress sutures.  A second incision was made over the dorsal radial aspect of the right wrist centered over the radial styloid.  Skin was incised sharply 3-4 cm.  Dissection was carried down the first dorsal compartment.  The first dorsal compartment was incised to the dorsal most extent, freeing up the APL and EPB tendons.  There was a separate EPB sheath that was released.  The wound was thoroughly irrigated and loosely closed with a 3-0 Prolene subcuticular stitch.   Steri-Strips, 4x4s, fluffs, and a compressive dressing and radial gutter splint were applied.  The patient tolerated both procedures well, went to recovery room in stable fashion.     Artist Pais Mina Marble, M.D.     MAW/MEDQ  D:  10/19/2016  T:  10/19/2016  Job:  [id number]

## 2016-10-19 NOTE — Progress Notes (Signed)
PT Cancellation Note  Patient Details Name: Angela Dudley MRN: [id number] DOB: 1945-06-15   Cancelled Treatment:    Reason Eval/Treat Not Completed: PT screened, no needs identified, will sign off Per RN, pt has been up and moving around in the room and has no skilled PT needs. Will sign off at this time. If needs change, please re-consult.   Gladys Damme, PT, DPT  Acute Rehabilitation Services  Pager: [phone]   Lehman Prom 10/19/2016, 12:58 PM

## 2016-10-20 ENCOUNTER — Encounter (HOSPITAL_COMMUNITY): Payer: Self-pay | Admitting: Orthopedic Surgery

## 2016-10-20 DIAGNOSIS — M65311 Trigger thumb, right thumb: Secondary | ICD-10-CM | POA: Diagnosis not present

## 2016-10-20 NOTE — Progress Notes (Signed)
Occupational Therapy Evaluation Patient Details Name: Angela Dudley MRN: [id number] DOB: 15-Mar-1945 Today's Date: 10/20/2016    History of Present Illness 71 yo s/p Right wrist first dorsal compartment release and right thumb. Extensive PMH, including Fibromyalgia,  OA, ACDF (2015/2016))   Clinical Impression   PTA, pt independent with ADL and IADL and lives alone. Completed education with pt regarding edema control and compensatory strategies for ADL. Pt safe to DC home when medically stable. Pt to follow up with MD for any further OT needs.     Follow Up Recommendations  DC plan and follow up therapy as arranged by surgeon    Equipment Recommendations  None recommended by OT    Recommendations for Other Services       Precautions / Restrictions Precautions Precautions: Other (comment) Required Braces or Orthoses: Other Brace/Splint (R hand postop splint) Restrictions Other Position/Activity Restrictions: none noted in chart      Mobility Bed Mobility Overal bed mobility: Independent                Transfers Overall transfer level: Independent                    Balance Overall balance assessment: No apparent balance deficits (not formally assessed)                                         ADL either performed or assessed with clinical judgement   ADL Overall ADL's : Needs assistance/impaired                                     Functional mobility during ADLs: Independent General ADL Comments: Educated on compensatory strategies for ADL. Pt states her bra is most difficult to fasten. Educated pt on not pulling with thumb at this time until released by MD. Educated onoption of using "non-slip mat to assist with holding material when hooking bra. Discussed options of front closure bras, using bras that do not have underwire in order to pull bra up over feet/over head, however, pt stated that she wouldnot consider wearign  anything but an underwire bra. Pt able to complete other ADL tasks. Educated on not pullilng/pushing with L thumb during ADL and IADL tasks. Educated pt on discussing adaptive technqieus with OT at MD's office. Pt also stated she has difficulty opening cans  - discussed option of ordering "Left handed can openers" online or purchasing an electric can opener. Pt states "Iam very frugal and just figure out a way". Pt also discussed pain in her L CMC joint - recommended pt discuss this withOT, as well as joint preservation techqnieus.      Vision         Perception     Praxis      Pertinent Vitals/Pain Pain Assessment: Faces Faces Pain Scale: Hurts a little bit Pain Location: L hand Pain Descriptors / Indicators: Discomfort Pain Intervention(s): Limited activity within patient's tolerance     Hand Dominance Left   Extremity/Trunk Assessment Upper Extremity Assessment Upper Extremity Assessment: LUE deficits/detail LUE Deficits / Details: postop splint (radial "gutter" with thumb immobilized past MP LUE: Unable to fully assess due to immobilization LUE Coordination: decreased fine motor       Cervical / Trunk Assessment Cervical / Trunk Assessment: Other exceptions (hx of  spinal surgeries)   Communication     Cognition Arousal/Alertness: Awake/alert Behavior During Therapy: WFL for tasks assessed/performed Overall Cognitive Status: Within Functional Limits for tasks assessed                                     General Comments       Exercises Exercises: Other exercises Other Exercises Other Exercises: Pt educated on elevation and use of ice for edema control. Other Exercises: Educated tomove fingers frequently   Shoulder Instructions      Home Living Family/patient expects to be discharged to:: Private residence Living Arrangements: Alone   Type of Home: House Home Access: Stairs to enter Secretary/administrator of Steps: 10 Entrance Stairs-Rails:  Left Home Layout: Two level Alternate Level Stairs-Number of Steps: 10 Alternate Level Stairs-Rails: Can reach both Bathroom Shower/Tub: Producer, television/film/video: Handicapped height Bathroom Accessibility: Yes How Accessible: Accessible via walker Home Equipment: Cane - single point;Shower seat - built in          Prior Functioning/Environment Level of Independence: Independent        Comments: uses a cane at times withher L hand        OT Problem List: Decreased strength;Decreased range of motion;Decreased knowledge of precautions;Obesity;Pain      OT Treatment/Interventions:      OT Goals(Current goals can be found in the care plan section) Acute Rehab OT Goals Patient Stated Goal: to go home OT Goal Formulation: All assessment and education complete, DC therapy  OT Frequency:     Barriers to D/C:            Co-evaluation              AM-PAC PT "6 Clicks" Daily Activity     Outcome Measure Help from another person eating meals?: None Help from another person taking care of personal grooming?: None Help from another person toileting, which includes using toliet, bedpan, or urinal?: None Help from another person bathing (including washing, rinsing, drying)?: None Help from another person to put on and taking off regular upper body clothing?: A Little Help from another person to put on and taking off regular lower body clothing?: None 6 Click Score: 23   End of Session Nurse Communication: Other (comment) (ready for DC)  Activity Tolerance: Patient tolerated treatment well Patient left: Other (comment) (in bathroom)  OT Visit Diagnosis: Muscle weakness (generalized) (M62.81);Pain Pain - Right/Left: Left Pain - part of body: Hand                Time: 1610-9604 OT Time Calculation (min): 22 min Charges:  OT General Charges $OT Visit: 1 Visit OT Evaluation $OT Eval Low Complexity: 1 Low G-Codes: OT G-codes **NOT FOR INPATIENT  CLASS** Functional Assessment Tool Used: Clinical judgement Functional Limitation: Self care Self Care Current Status (V4098): At least 1 percent but less than 20 percent impaired, limited or restricted Self Care Goal Status (J1914): At least 1 percent but less than 20 percent impaired, limited or restricted Self Care Discharge Status [phone]): At least 1 percent but less than 20 percent impaired, limited or restricted   [facility], OT/L  [phone] 10/20/2016  Noga Fogg,HILLARY 10/20/2016, 10:34 AM

## 2016-10-20 NOTE — Progress Notes (Signed)
Patient alert and oriented with no c/o pain. Patient discharge home per MD order. Patient stated understanding of instructions given. Patient drive herself home per instruction given.

## 2016-11-02 NOTE — Addendum Note (Signed)
Addendum  created 11/02/16 1645 by Bethena Midgetddono, Jeancarlo Leffler, MD   Sign clinical note

## 2016-11-02 NOTE — Anesthesia Postprocedure Evaluation (Signed)
Anesthesia Post Note  Patient: Angela MealyMary B Dudley  Procedure(s) Performed: RIGHT WRIST STENOSING TENOSYNOVITIS RELEASE  (Right ) RIGHT THUMB RELEASE TRIGGER FINGER/A-1 PULLEY (Right ) EXCISION DUBUYTRENS NODULE RIGHT MIDDLE FINGER (Right )     Anesthesia Type: General    Last Vitals:  Vitals:   10/20/16 0325 10/20/16 0738  BP: 130/63 122/65  Pulse: 86 74  Resp: 18 18  Temp: 36.7 C 36.8 C  SpO2: 94% 94%    Last Pain:  Vitals:   10/20/16 0325  TempSrc: Oral  PainSc:      I spoke with mrs elliot regarding her anesthesia on 10/16.  She is irritated about getting a general anesthetic,  Being intubated and getting constipated.  I reviewed her anesthetic with her and assured her the was not intubated ( instead got an LMA),  I explained the difference.  Furthermore she received less narcotics and versed than her previous two neck surgeries.  I told her that constipation was a common side effect of the pain medications she received and that next time for surgery she should consider a bowel prep  ej Kemesha Mosey jr md            Billyjack Trompeter

## 2017-09-24 ENCOUNTER — Other Ambulatory Visit: Payer: Self-pay | Admitting: Orthopedic Surgery

## 2017-09-24 DIAGNOSIS — M25562 Pain in left knee: Secondary | ICD-10-CM

## 2017-10-06 ENCOUNTER — Telehealth: Payer: Self-pay | Admitting: Neurology

## 2017-10-06 NOTE — Telephone Encounter (Signed)
I called the patient.  3 months ago she began having brief episodes of tilting to the right, she has not had any falls.  The episodes are quite brief in nature.  She may have one episode every week or 2.  About 2 weeks ago she was bitten by an insect, she began having headaches that have occurred since then.  We will try to get the patient worked in the office sometime in the next week or 2.

## 2017-10-06 NOTE — Telephone Encounter (Addendum)
Patient went to her PCP last Thursday because her body has been tilting intermittently x2 months. PCP office was to schedule an MRI but she says no one has called her to schedule. I advised she probably needs to call her PCP's office and discuss with them but she feels like she should see Dr. Anne Hahn. She can be reached after 2:30pm today.

## 2017-10-06 NOTE — Telephone Encounter (Signed)
Called pt. Offered appt on 10/14 at 730am. She declined stating this was too early. Offered appt 10/20/17 at 130pm. She accepted. She has another appt that day at 1pm but she is going to try and r/s that.

## 2017-10-06 NOTE — Telephone Encounter (Signed)
Dr. Anne Hahn- please advise. I checked and looks like she has MRI knee scheduled for tomorrow. Would you like me to schedule her an appt with you?

## 2017-10-07 ENCOUNTER — Ambulatory Visit
Admission: RE | Admit: 2017-10-07 | Discharge: 2017-10-07 | Disposition: A | Discharge status: Home / Self Care | Payer: Medicare Other | Source: Ambulatory Visit | Attending: Orthopedic Surgery | Admitting: Orthopedic Surgery

## 2017-10-07 DIAGNOSIS — M25562 Pain in left knee: Secondary | ICD-10-CM

## 2017-10-20 ENCOUNTER — Encounter: Payer: Self-pay | Admitting: Neurology

## 2017-10-20 ENCOUNTER — Ambulatory Visit (INDEPENDENT_AMBULATORY_CARE_PROVIDER_SITE_OTHER): Payer: Medicare Other | Admitting: Neurology

## 2017-10-20 ENCOUNTER — Telehealth: Payer: Self-pay | Admitting: Neurology

## 2017-10-20 VITALS — BP 121/58 | HR 95 | Ht 62.0 in | Wt 242.0 lb

## 2017-10-20 DIAGNOSIS — H814 Vertigo of central origin: Secondary | ICD-10-CM

## 2017-10-20 DIAGNOSIS — G459 Transient cerebral ischemic attack, unspecified: Secondary | ICD-10-CM | POA: Diagnosis not present

## 2017-10-20 DIAGNOSIS — R51 Headache: Secondary | ICD-10-CM | POA: Diagnosis not present

## 2017-10-20 DIAGNOSIS — G4489 Other headache syndrome: Secondary | ICD-10-CM

## 2017-10-20 DIAGNOSIS — G4486 Cervicogenic headache: Secondary | ICD-10-CM

## 2017-10-20 MED ORDER — NORTRIPTYLINE HCL 10 MG PO CAPS: ORAL_CAPSULE | ORAL | 3 refills | Status: DC

## 2017-10-20 NOTE — Telephone Encounter (Signed)
Medicare/[employer] order sent to [facility] for pt to be aware. I did leave their number of [phone] if she has not heard in the next 2-3 business days.

## 2017-10-20 NOTE — Patient Instructions (Signed)
We will start nrotriptyline for the headache.    Pamelor (nortriptyline) is an antidepressant medication that has many uses that may include headache, whiplash injuries, or for peripheral neuropathy pain. Side effects may include drowsiness, dry mouth, blurred vision, or constipation. As with any antidepressant medication, worsening depression may occur. If you had any significant side effects, please call our office. The full effects of this medication may take 7-10 days after starting the drug, or going up on the dose.

## 2017-10-20 NOTE — Telephone Encounter (Signed)
Patient called back stating she wants it at [facility] [place] I will faxed order and they will reach out to the pt to schedule.

## 2017-10-20 NOTE — Progress Notes (Signed)
Reason for visit: Headache, gait instability  Angela Dudley is a 72 y.o. female  History of present illness:  Angela Dudley is a 72 year old left-handed white female with a history of morbid obesity and cervicogenic headache.  The patient returns to the office today with a new issue.  The patient claims that about 2 or 3 months ago she began having episodes lasting a few moments where she would feel as if she were tilting to the right.  The patient would stumble but she has not fallen.  More recently, the episodes may occur with going backwards or going to the left or forwards.  The patient denies any sensation of dizziness or vertigo, she reports no other symptoms of visual disturbance, numbness or weakness of the extremities, slurred speech, or syncope.  The patient has also noted that time she has difficulty focusing while reading, she does not have overt double vision.  She began having daily headaches about 3 and half weeks ago, this may be associated with some slight nausea, but no vomiting.  The headaches are persistent.  The patient has been given Fioricet which does not help the headache.  The patient comes to this office for an evaluation of the above problems.  Past Medical History:  Diagnosis Date  . Abdominal pain, generalized   . Allergy   . Anemia   . Anxiety    pt denies  . Back pain   . Cervicogenic headache 06/08/2016  . Chronic fatigue syndrome   . Cramps, muscle, general   . Degenerative disc disease   . Diverticulosis of colon (without mention of hemorrhage)   . Dizziness   . Esophageal hernia   . Fever   . Fibromyalgia   . Frequent urination at night   . GERD (gastroesophageal reflux disease)   . H/O hiatal hernia   . Headache(784.0)   . Hemorrhoids   . Hyperlipidemia   . Hypothyroidism    no medication- endrocnologist said she never had it  . IBS (irritable bowel syndrome)   . Idiopathic peripheral autonomic neuropathy   . Numbness and tingling    hands,  feet, & lips  . Obesity   . Orthostatic hypotension    with prev tilt table test- 08/18/16- "have not passed out in years."  . Orthostatic hypotension   . Osteoarthritis   . Other B-complex deficiencies   . Peptic ulcer disease   . Peritoneal adhesions (postoperative) (postinfection)   . Sarcoidosis   . Sarcoidosis   . Shortness of breath    exertion- due to chronic fatigue syndrome, sarcoid  . Thyroid disease    Unspecified hypothyroidism  . Unspecified intestinal obstruction   . UTI (lower urinary tract infection)    hx of  . Vitreous floaters, left   . Wart viral     Past Surgical History:  Procedure Laterality Date  . ANTERIOR CERVICAL DECOMP/DISCECTOMY FUSION N/A 10/16/2013   Procedure: Cervical three-four, Cervical five-six Anterior cervical decompression/diskectomy/fusion;  Surgeon: Barnett Abu, MD;  Location: [facility];  Service: Neurosurgery;  Laterality: N/A;  Cervical three-four, Cervical five-six Anterior cervical decompression/diskectomy/fusion  . ANTERIOR CERVICAL DECOMP/DISCECTOMY FUSION N/A 07/23/2014   Procedure: Cervical four-five Anterior cervical decompression/diskectomy/fusion;  Surgeon: Barnett Abu, MD;  Location: [facility];  Service: Neurosurgery;  Laterality: N/A;  C4-5 Anterior cervical decompression/diskectomy/fusion  . APPENDECTOMY    . BACK SURGERY  2015   herniated disc - cervical  . BELPHAROPTOSIS REPAIR Bilateral   . BIOPSY THYROID  benign  . CARPAL TUNNEL RELEASE Bilateral   . CATARACT EXTRACTION Bilateral 1998  . CHOLECYSTECTOMY  2006  . COLON SURGERY  1996   partial colon removal  . COLONOSCOPY W/ POLYPECTOMY    . Doppler arterial studies  05/13/2008   Both carotid systems demonstrate minimal plaque formation with normal flow velocities throughout.  Vertebrals demonstrate antegrade flow bilaterally.  . DOPPLER ECHOCARDIOGRAPHY  2010   Diastolic relaxation abnormality, mild AVSC, mild MR/TR and mild to mod AR  .  ESOPHAGOGASTRODUODENOSCOPY    . HERNIA REPAIR  2005   [facility]  . LASIK  1998   Cataract  . LUNG BIOPSY    . LYMPH NODE DISSECTION     sternum  . MASS EXCISION Right 10/19/2016   Procedure: EXCISION DUBUYTRENS NODULE RIGHT MIDDLE FINGER;  Surgeon: Dairl Ponder, MD;  Location: [facility];  Service: Orthopedics;  Laterality: Right;  . NASAL SEPTUM SURGERY  2006  . NOSE SURGERY  2006  . Nuclear Test  08/2005   Normal (chest pain)  . PARS PLANA VITRECTOMY Left 08/20/2016  . PARS PLANA VITRECTOMY Left 08/20/2016   Procedure: PARS PLANA VITRECTOMY WITH 25 GAUGE;  Surgeon: Edmon Crape, MD;  Location: [facility] OR;  Service: Ophthalmology;  Laterality: Left;  . PARTIAL COLECTOMY  1996   complicated by an abscess formation  . Right hip bursa removal  1991  . SBO  2002  . TENOLYSIS Right 10/19/2016   Procedure: RIGHT WRIST STENOSING TENOSYNOVITIS RELEASE ;  Surgeon: Dairl Ponder, MD;  Location: [facility] OR;  Service: Orthopedics;  Laterality: Right;  . TONSILLECTOMY    . TRIGGER FINGER RELEASE Right 10/19/2016   Procedure: RIGHT THUMB RELEASE TRIGGER FINGER/A-1 PULLEY;  Surgeon: Dairl Ponder, MD;  Location: [facility] OR;  Service: Orthopedics;  Laterality: Right;    Family History  Problem Relation Age of Onset  . Crohn's disease Mother   . Heart disease Father   . Hyperlipidemia Neg Hx        No history of early CAD  . Cancer Neg Hx        No history of lymphoma or leukemia    Social history:  reports that she has never smoked. She has never used smokeless tobacco. She reports that she does not drink alcohol or use drugs.  Medications:  Prior to Admission medications   Medication Sig Start Date End Date Taking? Authorizing Provider  acetaminophen (TYLENOL) 500 MG tablet Take 1,000 mg by mouth every 6 (six) hours as needed for moderate pain or headache.   Yes [provider]  B Complex-C (B-COMPLEX WITH VITAMIN C) tablet Take 1 tablet by mouth daily.   Yes [provider]    Biotin 5000 MCG CAPS Take 5,000 mcg by mouth daily.    Yes [provider]  CALCIUM PO Take 2,000 mg by mouth at bedtime.    Yes [provider]  Coenzyme Q10 (COQ10) 200 MG CAPS Take 200 mg by mouth daily.   Yes [provider]  Ginger, Zingiber officinalis, (GINGER PO) Take 1,100 mg by mouth at bedtime.    Yes [provider]  GLUCOSAMINE-CHONDROITIN PO Take 1 tablet by mouth daily.   Yes [provider]  ipratropium (ATROVENT) 0.06 % nasal spray Place 2 sprays into both nostrils daily.   Yes [provider]  Lecithin 1200 MG CAPS Take 7,200-10,800 mg by mouth at bedtime as needed (for pain).    Yes [provider]  magnesium oxide (MAG-OX) 400 MG  tablet Take 400 mg by mouth daily.   Yes [provider]  Omega-3 Fatty Acids (OMEGA 3 PO) Take 1,360 mg by mouth daily.   Yes [provider]  Polyethyl Glycol-Propyl Glycol (SYSTANE) 0.4-0.3 % GEL ophthalmic gel Place 1 application into both eyes 2 (two) times daily as needed (dry eyes).   Yes [provider]  Potassium 99 MG TABS Take 495 mg by mouth at bedtime as needed (for additional potassium). May take an additional 99-198 mg as needed for electrolytes imbalance    Yes [provider]  potassium chloride SA (K-DUR,KLOR-CON) 20 MEQ tablet Take 1 tablet (20 mEq total) by mouth daily. Patient taking differently: Take 40 mEq by mouth daily.    Yes Joaquim Nam, MD  Probiotic Product (VSL#3) CAPS Take 1 capsule by mouth daily.   Yes [provider]  Selenium 200 MCG CAPS Take 200 mcg by mouth daily.    Yes [provider]  torsemide (DEMADEX) 10 MG tablet Take 20 mg by mouth daily.   Yes [provider]  TURMERIC CURCUMIN PO Take 1,600 mg by mouth at bedtime.    Yes [provider]  zinc gluconate 50 MG tablet Take 50 mg by mouth daily.     Yes [provider]      Allergies  Allergen Reactions  .  Esomeprazole Magnesium Other (See Comments)    REACTION: Esophageal spasm  . Other Other (See Comments)    Hepatotoxic Gases per MD request this is not to be used due to Chronic Fatigue Syndrome creates risk for Hepatitis.   Marland Kitchen Percocet [Oxycodone-Acetaminophen] Other (See Comments)    It "wires me" , I can't sleep  . Amoxicillin Rash and Other (See Comments)    Has patient had a PCN reaction causing immediate rash, facial/tongue/throat swelling, SOB or lightheadedness with hypotension: Yes Has patient had a PCN reaction causing severe rash involving mucus membranes or skin necrosis: No Has patient had a PCN reaction that required hospitalization: No Has patient had a PCN reaction occurring within the last 10 years: No If all of the above answers are "NO", then may proceed with Cephalosporin use.     ROS:  Out of a complete 14 system review of symptoms, the patient complains only of the following symptoms, and all other reviewed systems are negative.  Decreased activity, decreased appetite Light sensitivity Cough Leg swelling Nausea Restless legs, insomnia, frequent waking, daytime sleepiness Urinary urgency Joint pain, joint swelling, back pain, aching muscles, muscle cramps, walking difficulty, neck pain, neck stiffness Bruising easily Headache, right foot numbness  Blood pressure (!) 121/58, pulse 95, height 5\' 2"  (1.575 m), weight 242 lb (109.8 kg).  Physical Exam  General: The patient is alert and cooperative at the time of the examination.  The patient is markedly obese.  Eyes: Pupils are equal, round, and reactive to light. Discs are flat bilaterally.  Neck: The neck is supple, no carotid bruits are noted.  Respiratory: The respiratory examination is clear.  Cardiovascular: The cardiovascular examination reveals a regular rate and rhythm, no obvious murmurs or rubs are noted.  Skin: Extremities are with 2+ edema below the knees seen bilaterally..  Neurologic  Exam  Mental status: The patient is alert and oriented x 3 at the time of the examination. The patient has apparent normal recent and remote memory, with an apparently normal attention span and concentration ability.  Cranial nerves: Facial symmetry is present. There is good sensation of the face  to pinprick and soft touch bilaterally. The strength of the facial muscles and the muscles to head turning and shoulder shrug are normal bilaterally. Speech is well enunciated, no aphasia or dysarthria is noted. Extraocular movements are full. Visual fields are full. The tongue is midline, and the patient has symmetric elevation of the soft palate. No obvious hearing deficits are noted.  Motor: The motor testing reveals 5 over 5 strength of all 4 extremities. Good symmetric motor tone is noted throughout.  Sensory: Sensory testing is intact to pinprick, soft touch and vibration sensation on all 4 extremities, with exception some decrease in pinprick sensation on the right foot. No evidence of extinction is noted.  Coordination: Cerebellar testing reveals good finger-nose-finger and heel-to-shin bilaterally.  Gait and station: Gait is normal for her size.  She is unable to perform tandem gait.  Romberg is negative. No drift is seen.  Reflexes: Deep tendon reflexes are symmetric, but are slightly depressed bilaterally. Toes are downgoing bilaterally.   Assessment/Plan:  1.  New onset headache  2.  Episodic tilting episodes  The etiology of the episodes of tilting of the body is unclear, she has had one event even while sitting, most of the events occur while walking.  The episodes may occur several times a week.  The patient has no dizziness associated with these events.  The patient will be evaluated for possible TIA episodes.  The patient will have MRI of the brain, and MRA of the head.  Carotid Doppler study will be done.  The patient will have blood work done today.  She will be started on  nortriptyline for her headache beginning at 10 mg at night, will eventually working up to 30 mg at night.  She will call for any dose adjustments.  She will follow-up in 4 months.  Marlan Palau MD 10/20/2017 1:49 PM  [facility] [address] [place], [place] [address]  Phone [phone] Fax [phone]

## 2017-10-21 ENCOUNTER — Telehealth: Payer: Self-pay | Admitting: *Deleted

## 2017-10-21 LAB — SEDIMENTATION RATE: Sed Rate: 12 mm/hr (ref 0–40)

## 2017-10-21 NOTE — Telephone Encounter (Signed)
-----   Message from York Spaniel, MD sent at 10/21/2017  7:18 AM EDT -----  The blood work results are unremarkable. Please call the patient.  ----- Message ----- From: Nell Range Lab Results In Sent: 10/21/2017   5:39 AM EDT To: York Spaniel, MD

## 2017-10-21 NOTE — Telephone Encounter (Signed)
Noted, thank you

## 2017-10-21 NOTE — Telephone Encounter (Signed)
Called, LVM for pt to call about results.  Ok to inform her labs unremarkable per Dr. Anne Hahn, thank you

## 2017-10-21 NOTE — Telephone Encounter (Signed)
Pt aware labs were unremarkable °

## 2017-10-28 ENCOUNTER — Telehealth: Payer: Self-pay | Admitting: Neurology

## 2017-10-28 MED ORDER — BACLOFEN 10 MG PO TABS: ORAL_TABLET | ORAL | 3 refills | Status: DC

## 2017-10-28 NOTE — Telephone Encounter (Signed)
Spoke with pt. who sts. she has always had leg cramps, but they are much worse since starting Nortiptyline. She has tried adjusting her K, NA, Mag intake with some relief.  Would like to discuss with Dr. Ignacia Bayley

## 2017-10-28 NOTE — Telephone Encounter (Signed)
I called the patient.  The patient feels that the nortriptyline worsened her leg cramps, she went to 20 mg last night, she actually slept better but had more leg cramps.  She also feels that the headaches have improved on the medication.  She will stop the nortriptyline, I will try baclofen at night to help the headache and to help nocturnal leg cramps.

## 2017-10-28 NOTE — Addendum Note (Signed)
Addended by: York Spaniel on: 10/28/2017 04:47 PM   Modules accepted: Orders

## 2017-10-28 NOTE — Telephone Encounter (Signed)
Pt called stating that nortriptyline (PAMELOR) 10 MG capsule has been causing leg cramps, stating they are very intense. Would like a call to discuss if she should continue medication

## 2017-11-03 ENCOUNTER — Ambulatory Visit (HOSPITAL_COMMUNITY)
Admission: RE | Admit: 2017-11-03 | Discharge: 2017-11-03 | Disposition: A | Discharge status: Home / Self Care | Payer: Medicare Other | Source: Ambulatory Visit | Attending: Neurology | Admitting: Neurology

## 2017-11-03 ENCOUNTER — Telehealth: Payer: Self-pay | Admitting: Neurology

## 2017-11-03 ENCOUNTER — Ambulatory Visit
Admission: RE | Admit: 2017-11-03 | Discharge: 2017-11-03 | Disposition: A | Discharge status: Home / Self Care | Payer: Medicare Other | Source: Ambulatory Visit | Attending: Neurology | Admitting: Neurology

## 2017-11-03 DIAGNOSIS — G459 Transient cerebral ischemic attack, unspecified: Secondary | ICD-10-CM

## 2017-11-03 DIAGNOSIS — G4489 Other headache syndrome: Secondary | ICD-10-CM

## 2017-11-03 DIAGNOSIS — H814 Vertigo of central origin: Secondary | ICD-10-CM

## 2017-11-03 NOTE — Progress Notes (Signed)
Carotid duplex prelim: 1-39% ICA stenosis.  Angela Dudley, RDMS, RVT   

## 2017-11-03 NOTE — Telephone Encounter (Addendum)
I called the patient.  The carotid Doppler study appears to be normal, MRI of the brain and MRA of the head have been done, this has not yet been formally read.  By my evaluation, there does not appear to be any acute or chronic stroke issues, MRA of the head appears to be unremarkable.   MRI brain 11/03/17:  IMPRESSION:   MRI brain (without). No acute findings. - Minimal scattered periventricular and subcortical and pontine chronic small vessel ischemic disease. - No acute findings. - No change from 11/03/16.   MRA head 11/03/17:  IMPRESSION:   Normal MRA head (without).     Carotid doppler 11/03/17:  Summary: Right Carotid: Velocities in the right ICA are consistent with a 1-39% stenosis.  Left Carotid: Velocities in the left ICA are consistent with a 1-39% stenosis.  Vertebrals: Bilateral vertebral arteries demonstrate antegrade flow. Subclavians: Normal flow hemodynamics were seen in bilateral subclavian       arteries.

## 2017-11-04 NOTE — Telephone Encounter (Signed)
I called and talk with the patient.  The patient has what sounds like benign fasciculations that started 1 week ago of the lower neck and upper chest, no pain, no weakness.  I would watch this for now, no indication for ongoing work-up now.  I see no contraindication for her having eye surgery.  I will try to get the MRI brain report to her ENT doctor.

## 2017-11-04 NOTE — Telephone Encounter (Signed)
Spoke with Angela Dudley.  She has 2 surgeries planned--one eye procedure and one knee surgery. It looks like all testing was ok. She does have new c/o intermittent "twitching" in left side of neck radiating into left upper chest. No c/o pain and wonders if this needs to be eval. prior to any procedures. Sts. she is a little anxious about having procedures done at the start of flu season.Advised I will confirm with Dr. Anne Hahn that she is cleared for these procedures.

## 2017-11-04 NOTE — Telephone Encounter (Signed)
Pt would like to know if she is now cleared to have surgery, please call

## 2017-11-08 ENCOUNTER — Telehealth: Payer: Self-pay | Admitting: Neurology

## 2017-11-08 DIAGNOSIS — R269 Unspecified abnormalities of gait and mobility: Secondary | ICD-10-CM

## 2017-11-08 MED ORDER — GABAPENTIN 100 MG PO CAPS: 100.0000 mg | ORAL_CAPSULE | Freq: Every day | ORAL | 1 refills | Status: DC

## 2017-11-08 NOTE — Telephone Encounter (Signed)
I called the patient, she is not able to tolerate baclofen taking 5 mg twice daily.  We will stop the medication, start gabapentin 100 mg at night.  She is concerned about her neck, the headaches come up from the neck to the back of the head, she has had prior neck surgery.  I will set her up for MRI of the cervical spine.  The baclofen makes her more staggery, but her balance is changed before medication was started.

## 2017-11-08 NOTE — Addendum Note (Signed)
Addended by: York Spaniel on: 11/08/2017 04:02 PM   Modules accepted: Orders

## 2017-11-08 NOTE — Telephone Encounter (Signed)
Pt calling to inform that the dose of baclofen (LIORESAL) 10 MG tablet makes her woozy on her feet. Pt asking for a call to discuss

## 2017-11-08 NOTE — Telephone Encounter (Signed)
Spoke with pt.  She sts. she does ok taking Baclofen 10mg , 1/2 tablet (5mg  total) at hs, but she is not able to tolerate the am dose of 5mg .  It causes her to be too unsteady on her feet. "I can't hardly stand up."  Will check with Dr. Anne Hahn to see how he would like to proceed/fim

## 2017-11-09 ENCOUNTER — Telehealth: Payer: Self-pay | Admitting: Neurology

## 2017-11-09 NOTE — Telephone Encounter (Signed)
Medicare well path auth: npr order sent to GI. They will reach out to the pt to schedule.

## 2017-11-09 NOTE — Telephone Encounter (Signed)
lvm for pt to be aware of this. I also left GI phone number of [phone] and to give them a call if she has not heard in the next 2-3 business days.  °

## 2017-11-15 ENCOUNTER — Other Ambulatory Visit: Payer: Self-pay | Admitting: Ophthalmology

## 2017-11-20 ENCOUNTER — Ambulatory Visit
Admission: RE | Admit: 2017-11-20 | Discharge: 2017-11-20 | Disposition: A | Discharge status: Home / Self Care | Payer: Medicare Other | Source: Ambulatory Visit | Attending: Neurology | Admitting: Neurology

## 2017-11-20 DIAGNOSIS — R269 Unspecified abnormalities of gait and mobility: Secondary | ICD-10-CM

## 2017-11-21 ENCOUNTER — Telehealth: Payer: Self-pay | Admitting: Neurology

## 2017-11-21 MED ORDER — GABAPENTIN 100 MG PO CAPS: 200.0000 mg | ORAL_CAPSULE | Freq: Every day | ORAL | 1 refills | Status: DC

## 2017-11-21 NOTE — Telephone Encounter (Signed)
I called the patient.  The MRI of the cervical spine appears to be stable from 2018.  The patient is on 100 mg of gabapentin at night, she may try going up to 200 mg at night, she finds that this is helping her sleep and helping her headache.  If she wishes to go into neuromuscular therapy, she is to contact our office.    MRI 11/20/17:  IMPRESSION: This MRI of the cervical spine without contrast shows the following: 1.   The spinal cord appears normal. 2.   Remote C3-C6 fusion. 3.   Disc protrusion at C2-C3 that does not cause spinal stenosis or nerve root compression 4.   Disc bulging C6-C7 that does not cause spinal stenosis or nerve root compression. 5.   There are no changes compared to the 05/03/2016 MRI.

## 2017-11-29 ENCOUNTER — Other Ambulatory Visit: Payer: Self-pay

## 2017-11-29 ENCOUNTER — Encounter (HOSPITAL_COMMUNITY): Payer: Self-pay | Admitting: *Deleted

## 2017-11-29 NOTE — Progress Notes (Signed)
Spoke with pt for pre-op call. Pt denies cardiac history, HTN, or Diabetes. Pt has chronic fatigue syndrome.

## 2017-11-30 ENCOUNTER — Encounter (HOSPITAL_COMMUNITY): Payer: Self-pay | Admitting: Anesthesiology

## 2017-11-30 ENCOUNTER — Inpatient Hospital Stay (HOSPITAL_COMMUNITY): Payer: Medicare Other | Admitting: Anesthesiology

## 2017-11-30 ENCOUNTER — Observation Stay (HOSPITAL_COMMUNITY)
Admission: RE | Admit: 2017-11-30 | Discharge: 2017-12-01 | Disposition: A | Discharge status: Home / Self Care | Payer: Medicare Other | Source: Ambulatory Visit | Attending: Ophthalmology | Admitting: Ophthalmology

## 2017-11-30 ENCOUNTER — Encounter (HOSPITAL_COMMUNITY)
Admission: RE | Disposition: A | Discharge status: Home / Self Care | Payer: Self-pay | Source: Ambulatory Visit | Attending: Ophthalmology

## 2017-11-30 DIAGNOSIS — H33321 Round hole, right eye: Secondary | ICD-10-CM | POA: Diagnosis not present

## 2017-11-30 DIAGNOSIS — G4730 Sleep apnea, unspecified: Secondary | ICD-10-CM | POA: Insufficient documentation

## 2017-11-30 DIAGNOSIS — Z6841 Body Mass Index (BMI) 40.0 and over, adult: Secondary | ICD-10-CM | POA: Diagnosis not present

## 2017-11-30 DIAGNOSIS — H43311 Vitreous membranes and strands, right eye: Principal | ICD-10-CM | POA: Diagnosis present

## 2017-11-30 DIAGNOSIS — E669 Obesity, unspecified: Secondary | ICD-10-CM | POA: Diagnosis not present

## 2017-11-30 DIAGNOSIS — H43391 Other vitreous opacities, right eye: Secondary | ICD-10-CM | POA: Diagnosis present

## 2017-11-30 HISTORY — PX: PHOTOCOAGULATION WITH LASER: SHX6027

## 2017-11-30 HISTORY — PX: PARS PLANA VITRECTOMY: SHX2166

## 2017-11-30 LAB — BASIC METABOLIC PANEL
Anion gap: 8 (ref 5–15)
BUN: 19 mg/dL (ref 8–23)
CALCIUM: 9.5 mg/dL (ref 8.9–10.3)
CO2: 25 mmol/L (ref 22–32)
Chloride: 106 mmol/L (ref 98–111)
Creatinine, Ser: 0.71 mg/dL (ref 0.44–1.00)
GFR calc Af Amer: >60 mL/min (ref >60)
Glucose, Bld: 126 mg/dL — ABNORMAL HIGH (ref 70–99)
Potassium: 4.1 mmol/L (ref 3.5–5.1)
SODIUM: 139 mmol/L (ref 135–145)

## 2017-11-30 LAB — CBC
HCT: 47.3 % — ABNORMAL HIGH (ref 36.0–46.0)
Hemoglobin: 15 g/dL (ref 12.0–15.0)
MCH: 29.9 pg (ref 26.0–34.0)
MCHC: 31.7 g/dL (ref 30.0–36.0)
MCV: 94.2 fL (ref 80.0–100.0)
NRBC: 0 % (ref 0.0–0.2)
PLATELETS: 226 10*3/uL (ref 150–400)
RBC: 5.02 MIL/uL (ref 3.87–5.11)
RDW: 13.7 % (ref 11.5–15.5)
WBC: 7.5 10*3/uL (ref 4.0–10.5)

## 2017-11-30 SURGERY — PARS PLANA VITRECTOMY WITH 25 GAUGE
Anesthesia: Monitor Anesthesia Care | Site: Eye | Laterality: Right

## 2017-11-30 MED ORDER — BACITRACIN-POLYMYXIN B 500-10000 UNIT/GM OP OINT
TOPICAL_OINTMENT | OPHTHALMIC | Status: AC
  Filled 2017-11-30: qty 3.5

## 2017-11-30 MED ORDER — MAGNESIUM HYDROXIDE 400 MG/5ML PO SUSP: 15.0000 mL | Freq: Four times a day (QID) | ORAL | Status: DC | PRN

## 2017-11-30 MED ORDER — STERILE WATER FOR INJECTION IJ SOLN
INTRAMUSCULAR | Status: AC
  Filled 2017-11-30: qty 10

## 2017-11-30 MED ORDER — LIDOCAINE HCL 2 % IJ SOLN
INTRAMUSCULAR | Status: AC
  Filled 2017-11-30: qty 20

## 2017-11-30 MED ORDER — LIDOCAINE HCL 2 % IJ SOLN
INTRAMUSCULAR | Status: DC | PRN
  Administered 2017-11-30: 10 mL

## 2017-11-30 MED ORDER — STERILE WATER FOR INJECTION IJ SOLN
INTRAMUSCULAR | Status: DC | PRN
  Administered 2017-11-30: 100 mL

## 2017-11-30 MED ORDER — CYCLOPENTOLATE HCL 1 % OP SOLN
1.0000 [drp] | OPHTHALMIC | Status: AC | PRN
  Administered 2017-11-30 (×3): 1 [drp] via OPHTHALMIC

## 2017-11-30 MED ORDER — HYPROMELLOSE (GONIOSCOPIC) 2.5 % OP SOLN
OPHTHALMIC | Status: DC | PRN
  Administered 2017-11-30: 2 [drp] via OPHTHALMIC

## 2017-11-30 MED ORDER — TOBRAMYCIN-DEXAMETHASONE 0.3-0.1 % OP OINT
TOPICAL_OINTMENT | OPHTHALMIC | Status: AC
  Filled 2017-11-30: qty 3.5

## 2017-11-30 MED ORDER — ONDANSETRON HCL 4 MG/2ML IJ SOLN: 4.0000 mg | Freq: Three times a day (TID) | INTRAMUSCULAR | Status: DC | PRN

## 2017-11-30 MED ORDER — PREDNISOLONE ACETATE 1 % OP SUSP
1.0000 [drp] | Freq: Four times a day (QID) | OPHTHALMIC | Status: DC
  Administered 2017-11-30 – 2017-12-01 (×3): 1 [drp] via OPHTHALMIC
  Filled 2017-11-30: qty 5

## 2017-11-30 MED ORDER — GATIFLOXACIN 0.5 % OP SOLN
1.0000 [drp] | Freq: Four times a day (QID) | OPHTHALMIC | Status: DC
  Administered 2017-11-30 (×2): 1 [drp] via OPHTHALMIC

## 2017-11-30 MED ORDER — CYCLOPENTOLATE HCL 1 % OP SOLN
OPHTHALMIC | Status: AC
  Administered 2017-11-30: 1 [drp] via OPHTHALMIC
  Filled 2017-11-30: qty 2

## 2017-11-30 MED ORDER — GATIFLOXACIN 0.5 % OP SOLN
OPHTHALMIC | Status: AC
  Administered 2017-11-30: 1 [drp] via OPHTHALMIC
  Filled 2017-11-30: qty 2.5

## 2017-11-30 MED ORDER — INDOCYANINE GREEN 25 MG IV SOLR
INTRAVENOUS | Status: AC
  Filled 2017-11-30: qty 25

## 2017-11-30 MED ORDER — ACETAMINOPHEN 325 MG PO TABS: 650.0000 mg | ORAL_TABLET | ORAL | Status: DC | PRN

## 2017-11-30 MED ORDER — DEXAMETHASONE SODIUM PHOSPHATE 10 MG/ML IJ SOLN
INTRAMUSCULAR | Status: DC | PRN
  Administered 2017-11-30: 10 mg

## 2017-11-30 MED ORDER — TETRACAINE HCL 0.5 % OP SOLN
OPHTHALMIC | Status: AC
  Filled 2017-11-30: qty 4

## 2017-11-30 MED ORDER — BSS PLUS IO SOLN
INTRAOCULAR | Status: AC
  Filled 2017-11-30: qty 500

## 2017-11-30 MED ORDER — PROPOFOL 10 MG/ML IV BOLUS
INTRAVENOUS | Status: DC | PRN
  Administered 2017-11-30: 60 mg via INTRAVENOUS

## 2017-11-30 MED ORDER — LACTATED RINGERS IV SOLN: INTRAVENOUS | Status: DC

## 2017-11-30 MED ORDER — FENTANYL CITRATE (PF) 100 MCG/2ML IJ SOLN: 25.0000 ug | INTRAMUSCULAR | Status: DC | PRN

## 2017-11-30 MED ORDER — GENTAMICIN SULFATE 40 MG/ML IJ SOLN
INTRAMUSCULAR | Status: AC
  Filled 2017-11-30: qty 2

## 2017-11-30 MED ORDER — WHITE PETROLATUM EX OINT
TOPICAL_OINTMENT | CUTANEOUS | Status: AC
  Administered 2017-11-30: 22:00:00
  Filled 2017-11-30: qty 28.35

## 2017-11-30 MED ORDER — EPINEPHRINE PF 1 MG/ML IJ SOLN
INTRAMUSCULAR | Status: AC
  Filled 2017-11-30: qty 1

## 2017-11-30 MED ORDER — PHENYLEPHRINE HCL 2.5 % OP SOLN
OPHTHALMIC | Status: AC
  Administered 2017-11-30: 1 [drp] via OPHTHALMIC
  Filled 2017-11-30: qty 2

## 2017-11-30 MED ORDER — BSS IO SOLN
INTRAOCULAR | Status: AC
  Filled 2017-11-30: qty 15

## 2017-11-30 MED ORDER — HYPROMELLOSE (GONIOSCOPIC) 2.5 % OP SOLN
OPHTHALMIC | Status: AC
  Filled 2017-11-30: qty 15

## 2017-11-30 MED ORDER — SODIUM CHLORIDE 0.9 % IV SOLN
INTRAVENOUS | Status: DC | PRN
  Administered 2017-11-30: 13:00:00 via INTRAVENOUS

## 2017-11-30 MED ORDER — GATIFLOXACIN 0.5 % OP SOLN
1.0000 [drp] | OPHTHALMIC | Status: AC | PRN
  Administered 2017-11-30 (×3): 1 [drp] via OPHTHALMIC

## 2017-11-30 MED ORDER — DEXAMETHASONE SODIUM PHOSPHATE 10 MG/ML IJ SOLN
INTRAMUSCULAR | Status: AC
  Filled 2017-11-30: qty 1

## 2017-11-30 MED ORDER — PHENYLEPHRINE HCL 2.5 % OP SOLN
1.0000 [drp] | OPHTHALMIC | Status: AC | PRN
  Administered 2017-11-30 (×3): 1 [drp] via OPHTHALMIC

## 2017-11-30 MED ORDER — ONDANSETRON HCL 4 MG/2ML IJ SOLN: 4.0000 mg | Freq: Once | INTRAMUSCULAR | Status: DC | PRN

## 2017-11-30 MED ORDER — BSS IO SOLN
INTRAOCULAR | Status: DC | PRN
  Administered 2017-11-30: 15 mL via INTRAOCULAR

## 2017-11-30 MED ORDER — EPINEPHRINE PF 1 MG/ML IJ SOLN
INTRAOCULAR | Status: DC | PRN
  Administered 2017-11-30: 500.3 mL

## 2017-11-30 MED ORDER — POLYMYXIN B SULFATE 500000 UNITS IJ SOLR
INTRAMUSCULAR | Status: AC
  Filled 2017-11-30: qty 500000

## 2017-11-30 SURGICAL SUPPLY — 60 items
APL SRG 3 HI ABS STRL LF PLS (MISCELLANEOUS) ×1
APPLICATOR COTTON TIP 6IN STRL (MISCELLANEOUS) ×2 IMPLANT
APPLICATOR DR MATTHEWS STRL (MISCELLANEOUS) ×1 IMPLANT
BLADE MVR KNIFE 20G (BLADE) IMPLANT
CANNULA ANT CHAM MAIN (OPHTHALMIC RELATED) IMPLANT
CANNULA VLV SOFT TIP 25G (OPHTHALMIC) ×1 IMPLANT
CANNULA VLV SOFT TIP 25GA (OPHTHALMIC) ×2 IMPLANT
CORDS BIPOLAR (ELECTRODE) IMPLANT
COVER MAYO STAND STRL (DRAPES) ×1 IMPLANT
COVER WAND RF STERILE (DRAPES) ×2 IMPLANT
DRAPE INCISE 51X51 W/FILM STRL (DRAPES) ×1 IMPLANT
DRAPE OPHTHALMIC 77X100 STRL (CUSTOM PROCEDURE TRAY) ×2 IMPLANT
FILTER BLUE MILLIPORE (MISCELLANEOUS) IMPLANT
FORCEPS ECKARDT ILM 25G SERR (OPHTHALMIC RELATED) IMPLANT
FORCEPS GRIESHABER ILM 25G A (INSTRUMENTS) IMPLANT
FORCEPS HORIZONTAL 25G DISP (OPHTHALMIC RELATED) IMPLANT
GAS AUTO FILL CONSTEL (OPHTHALMIC)
GAS AUTO FILL CONSTELLATION (OPHTHALMIC) IMPLANT
GLOVE SS BIOGEL STRL SZ 8.5 (GLOVE) ×1 IMPLANT
GLOVE SUPERSENSE BIOGEL SZ 8.5 (GLOVE) ×1
GOWN STRL REUS W/ TWL LRG LVL3 (GOWN DISPOSABLE) ×1 IMPLANT
GOWN STRL REUS W/ TWL XL LVL3 (GOWN DISPOSABLE) ×1 IMPLANT
GOWN STRL REUS W/TWL LRG LVL3 (GOWN DISPOSABLE) ×2
GOWN STRL REUS W/TWL XL LVL3 (GOWN DISPOSABLE) ×2
KIT BASIN OR (CUSTOM PROCEDURE TRAY) ×2 IMPLANT
KNIFE CRESCENT 2.5 55 ANG (BLADE) IMPLANT
LENS BIOM SUPER VIEW SET DISP (OPHTHALMIC RELATED) ×2 IMPLANT
MICROPICK 25G (MISCELLANEOUS)
NDL 18GX1X1/2 (RX/OR ONLY) (NEEDLE) IMPLANT
NDL 25GX 5/8IN NON SAFETY (NEEDLE) IMPLANT
NDL FILTER BLUNT 18X1 1/2 (NEEDLE) IMPLANT
NDL HYPO 25GX1X1/2 BEV (NEEDLE) IMPLANT
NEEDLE 18GX1X1/2 (RX/OR ONLY) (NEEDLE) ×2 IMPLANT
NEEDLE 25GX 5/8IN NON SAFETY (NEEDLE) IMPLANT
NEEDLE FILTER BLUNT 18X 1/2SAF (NEEDLE)
NEEDLE FILTER BLUNT 18X1 1/2 (NEEDLE) IMPLANT
NEEDLE HYPO 25GX1X1/2 BEV (NEEDLE) IMPLANT
NS IRRIG 1000ML POUR BTL (IV SOLUTION) ×2 IMPLANT
PACK FRAGMATOME (OPHTHALMIC) IMPLANT
PACK VITRECTOMY CUSTOM (CUSTOM PROCEDURE TRAY) ×2 IMPLANT
PAD ARMBOARD 7.5X6 YLW CONV (MISCELLANEOUS) ×4 IMPLANT
PAK PIK VITRECTOMY CVS 25GA (OPHTHALMIC) ×2 IMPLANT
PENCIL BIPOLAR 25GA STR DISP (OPHTHALMIC RELATED) IMPLANT
PICK MICROPICK 25G (MISCELLANEOUS) IMPLANT
PROBE LASER ILLUM FLEX CVD 25G (OPHTHALMIC) ×1 IMPLANT
ROLLS DENTAL (MISCELLANEOUS) IMPLANT
SCRAPER DIAMOND 25GA (OPHTHALMIC RELATED) IMPLANT
SET INJECTOR OIL FLUID CONSTEL (OPHTHALMIC) IMPLANT
STOCKINETTE IMPERVIOUS 9X36 MD (GAUZE/BANDAGES/DRESSINGS) ×4 IMPLANT
STOPCOCK 4 WAY LG BORE MALE ST (IV SETS) IMPLANT
SUT ETHILON 10 0 CS140 6 (SUTURE) IMPLANT
SUT ETHILON 8 0 BV130 4 (SUTURE) IMPLANT
SUT MERSILENE 5 0 RD 1 DA (SUTURE) IMPLANT
SUT PROLENE 10 0 CIF 4 DA (SUTURE) IMPLANT
SUT VICRYL 7 0 TG140 8 (SUTURE) ×1 IMPLANT
SYR 30ML SLIP (SYRINGE) IMPLANT
SYR 5ML LL (SYRINGE) IMPLANT
SYR TB 1ML LUER SLIP (SYRINGE) IMPLANT
WATER STERILE IRR 1000ML POUR (IV SOLUTION) ×2 IMPLANT
WIPE INSTRUMENT VISIWIPE 73X73 (MISCELLANEOUS) ×1 IMPLANT

## 2017-11-30 NOTE — Anesthesia Postprocedure Evaluation (Signed)
Anesthesia Post Note  Patient: Angela Dudley  Procedure(s) Performed: PARS PLANA VITRECTOMY WITH 25 GAUGE (Right Eye) PHOTOCOAGULATION WITH LASER (Right Eye)     Patient location during evaluation: PACU Anesthesia Type: MAC Level of consciousness: awake and alert Pain management: pain level controlled Vital Signs Assessment: post-procedure vital signs reviewed and stable Respiratory status: spontaneous breathing, nonlabored ventilation, respiratory function stable and patient connected to nasal cannula oxygen Cardiovascular status: stable and blood pressure returned to baseline Postop Assessment: no apparent nausea or vomiting Anesthetic complications: no    Last Vitals:  Vitals:   11/30/17 1421 11/30/17 1500  BP:  118/61  Pulse:  80  Resp:  17  Temp: (!) 36.4 C 36.6 C  SpO2:  92%    Last Pain:  Vitals:   11/30/17 1500  TempSrc: Oral  PainSc:                  Zakkiyya Barno COKER

## 2017-11-30 NOTE — Anesthesia Preprocedure Evaluation (Addendum)
Anesthesia Evaluation  Patient identified by MRN, date of birth, ID band Patient awake    Reviewed: Allergy & Precautions, NPO status , Patient's Chart, lab work & pertinent test results  Airway Mallampati: II  TM Distance: >3 FB Neck ROM: Full    Dental  (+) Teeth Intact, Dental Advisory Given   Pulmonary    breath sounds clear to auscultation       Cardiovascular  Rhythm:Regular     Neuro/Psych    GI/Hepatic   Endo/Other    Renal/GU      Musculoskeletal   Abdominal (+) + obese,   Peds  Hematology   Anesthesia Other Findings   Reproductive/Obstetrics                            Anesthesia Physical Anesthesia Plan  ASA: II  Anesthesia Plan: MAC   Post-op Pain Management:    Induction: Intravenous  PONV Risk Score and Plan: Ondansetron and Dexamethasone  Airway Management Planned: Natural Airway and Nasal Cannula  Additional Equipment:   Intra-op Plan:   Post-operative Plan:   Informed Consent: I have reviewed the patients History and Physical, chart, labs and discussed the procedure including the risks, benefits and alternatives for the proposed anesthesia with the patient or authorized representative who has indicated his/her understanding and acceptance.   Dental advisory given  Plan Discussed with: CRNA and Anesthesiologist  Anesthesia Plan Comments:        Anesthesia Quick Evaluation

## 2017-11-30 NOTE — Brief Op Note (Signed)
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  Anesthesia:  Local retrobulbar with monitored anesthesia control, a cane 2% 5 mL retrobulbar and 5 mL modified Darel HongVan Lint, Edia Pursifull modification  complications none

## 2017-11-30 NOTE — Transfer of Care (Signed)
Immediate Anesthesia Transfer of Care Note  Patient: Angela Dudley  Procedure(s) Performed: PARS PLANA VITRECTOMY WITH 25 GAUGE (Right Eye) PHOTOCOAGULATION WITH LASER (Right Eye)  Patient Location: PACU  Anesthesia Type:MAC  Level of Consciousness: awake, alert  and oriented  Airway & Oxygen Therapy: Patient Spontanous Breathing  Post-op Assessment: Report given to RN and Post -op Vital signs reviewed and stable  Post vital signs: Reviewed and stable  Last Vitals:  Vitals Value Taken Time  BP 91/71 11/30/2017  1:41 PM  Temp    Pulse 83 11/30/2017  1:42 PM  Resp 17 11/30/2017  1:42 PM  SpO2 96 % 11/30/2017  1:42 PM  Vitals shown include unvalidated device data.  Last Pain:  Vitals:   11/30/17 1103  TempSrc: Oral  PainSc: 0-No pain      Patients Stated Pain Goal: 0 (51/70/01 7494)  Complications: No apparent anesthesia complications

## 2017-11-30 NOTE — Anesthesia Procedure Notes (Signed)
Procedure Name: MAC Date/Time: 11/30/2017 12:48 PM Performed by: Marsa Aris, CRNA Pre-anesthesia Checklist: Patient identified, Emergency Drugs available, Suction available, Patient being monitored and Timeout performed Patient Re-evaluated:Patient Re-evaluated prior to induction Oxygen Delivery Method: Nasal cannula Preoxygenation: Pre-oxygenation with 100% oxygen

## 2017-11-30 NOTE — Progress Notes (Addendum)
Pt refusing carb mod diet, pt has no history of diabetes, glucose preop 126. Pt is obese.  Refusing to eat unless diet changed. Diet changed to regular so that pt will eat.

## 2017-11-30 NOTE — Plan of Care (Signed)
  Problem: Education: Goal: Knowledge of post-operative educational information will improve Outcome: Progressing   Problem: Safety: Goal: Ability to remain free from injury will improve Outcome: Progressing   Problem: Activity: Goal: Risk for activity intolerance will decrease Outcome: Progressing   

## 2017-11-30 NOTE — H&P (Signed)
Patient has history of persistent floaters, and vitreous opacities right eye, which contribute to ongoing impact on activities of daily living, affecting her daily life.  Patient chooses to proceed with vitrectomy RIGHT eye, in order to improve visual functioning.  Past history of vitrectomy, lefte eye Aug 2018 for similar reason.   Vision OD 20/25,, with shadowing  Exam confirms anterior vitreous opacities nearly immobile.  Retina with no holes or tears right eye    Impression:  Vitreous membranes and strands right eye.  Plan:  Posterior vitrectomy right eye, under local MAC.  Risks and benefits reviewed.  Pt has history of obesity, sleep apnea, and poor social support and requests 23 observation overnite stay.

## 2017-11-30 NOTE — Progress Notes (Addendum)
Patient refused to wear TED hose post-op.  Pt educated on the importance of DVT prevention and still does not want to participate in plan of care.

## 2017-11-30 NOTE — Op Note (Signed)
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  Anesthesia local retrobulbar with monitored anesthesia control, lidocaine 2%  Complications none  Indications patient has profound visual loss affecting activities of daily living with vitreous debris membranes and globules that are in her anterior mid vitreous affecting her quality of life visual functioning.  Patien tunderstands that this is in attempt to clear the vitreous specifications. Patient sent the risk of anesthesia including rare occurrence death but also to the eye including but not limited to hemorrhage, infection, scarring, need for another surgery, no change of vision and progression of disease despite intervention.  Appropriate signed consent was obtained.  In the operating room appropriate monitoring was followed by mild sedation. Appropriate surgical timeout was carried out with staff and surgeon identifying the right eye is a operative site. Thereafter 2% Xylocaine 5 mL injected retrobulbar with additional 5 mL laterally in fashion of modified Darel HongVan Lint. The] region sterilely prepped draped usual fashion. Lid speculum applied. 25-gauge trocar placed in the inferotemporal quadrant placement verified visually. Infusion was turned on. superior trochars were applied. Core vitrectomy was begun.  Posterior hyaloid spontaneously detach done thus was trimmed. Notable findings of multiple pigmentary degenerative changes in the cobblestone fashion through 60 and evidence of a operculated retinal hole at 11:00 peripherally. Focal endolaser photo regulation was applied in this area for retinopexy purposes. No complications occurred. Anterior hyaloid was confirmed to be removed. At this time spear  trochars were then removed. There 10 to be some gaping of the sclera for this reason I did apply several black suture to close each of the sclerotomies in each quadrant. Subconjunctival Decadron applied. Sterile patch function applied. No comp cases occurred

## 2017-12-01 ENCOUNTER — Encounter (HOSPITAL_COMMUNITY): Payer: Self-pay | Admitting: Ophthalmology

## 2017-12-01 ENCOUNTER — Other Ambulatory Visit: Payer: Self-pay

## 2017-12-01 MED ORDER — GATIFLOXACIN 0.5 % OP SOLN: 1.0000 [drp] | Freq: Four times a day (QID) | OPHTHALMIC | 0 refills | Status: DC

## 2017-12-01 MED ORDER — PREDNISOLONE ACETATE 1 % OP SUSP: 1.0000 [drp] | Freq: Four times a day (QID) | OPHTHALMIC | 0 refills | Status: DC

## 2017-12-01 NOTE — Progress Notes (Signed)
Pt left via w/c for front with guest services this AM, she was driving self to MD's office

## 2017-12-06 ENCOUNTER — Telehealth: Payer: Self-pay | Admitting: Neurology

## 2017-12-06 MED ORDER — GABAPENTIN 100 MG PO CAPS: 200.0000 mg | ORAL_CAPSULE | Freq: Every day | ORAL | 1 refills | Status: DC

## 2017-12-06 NOTE — Telephone Encounter (Signed)
Gabapentin escribed to [facility] as requested/fim

## 2017-12-06 NOTE — Telephone Encounter (Signed)
Pt states re: her gabapentin (NEURONTIN) 100 MG capsule her pharmacy [facility]  Has not received a prescription showing she is to now take 2 at night.  Pt asking a revised prescription be sent

## 2017-12-06 NOTE — Addendum Note (Signed)
Addended by: Candis SchatzMISENHEIMER, Eland Lamantia I on: 12/06/2017 01:08 PM   Modules accepted: Orders

## 2017-12-13 NOTE — Discharge Summary (Signed)
Pt remained in overnight observation due to social reasons per her direct request.  She reports no one available to be with her overnight at home post sedation, local anesthesia.  Procedures:  Vitrectomy right eye for vision loss from debris, membranes and floaters.  Complications none  Discharge to home next day,, as outpatient.  Proceed to office for examination.

## 2018-02-02 ENCOUNTER — Other Ambulatory Visit: Payer: Self-pay | Admitting: Neurology

## 2018-02-08 ENCOUNTER — Encounter: Payer: Self-pay | Admitting: Neurology

## 2018-02-08 ENCOUNTER — Ambulatory Visit (INDEPENDENT_AMBULATORY_CARE_PROVIDER_SITE_OTHER): Payer: Medicare Other | Admitting: Neurology

## 2018-02-08 VITALS — BP 103/62 | HR 91 | Ht 62.0 in | Wt 238.0 lb

## 2018-02-08 DIAGNOSIS — M4722 Other spondylosis with radiculopathy, cervical region: Secondary | ICD-10-CM

## 2018-02-08 DIAGNOSIS — R51 Headache: Secondary | ICD-10-CM | POA: Diagnosis not present

## 2018-02-08 DIAGNOSIS — G4486 Cervicogenic headache: Secondary | ICD-10-CM

## 2018-02-08 MED ORDER — GABAPENTIN 100 MG PO CAPS: 100.0000 mg | ORAL_CAPSULE | Freq: Every day | ORAL | 1 refills | Status: DC

## 2018-02-08 NOTE — Progress Notes (Signed)
Reason for visit: Cervicogenic headache  Angela Dudley is an 73 y.o. female  History of present illness:  Angela Dudley is a 73 year old left-handed white female with a history of morbid obesity and cervical spondylosis.  The patient has a cervicogenic headache, she oftentimes will wake up with headaches.  She has been very sensitive to prescription medications, she could not tolerate low-dose nortriptyline, baclofen, and now gabapentin.  The patient is only able to tolerate 100 mg of gabapentin in the evening hours.  She mainly takes nutritional supplements.  She has significant degenerative arthritis of the left knee, she may require surgery for this in the future.  She has had some problems with orthostatic hypotension, she recently has had some episodes of dizziness while even sitting.  She comes to the office today for an evaluation.  Past Medical History:  Diagnosis Date  . Abdominal pain, generalized   . Allergy   . Anemia   . Anxiety    pt denies  . Back pain   . Cervicogenic headache 06/08/2016  . Chronic fatigue syndrome   . Cramps, muscle, general   . Degenerative disc disease   . Diverticulosis of colon (without mention of hemorrhage)   . Dizziness   . Esophageal hernia   . Fever   . Fibromyalgia   . Frequent urination at night   . GERD (gastroesophageal reflux disease)    under control  . H/O hiatal hernia   . Headache(784.0)   . Hemorrhoids   . Hyperlipidemia   . Hypothyroidism    no medication- endrocnologist said she never had it  . IBS (irritable bowel syndrome)   . Idiopathic peripheral autonomic neuropathy   . Numbness and tingling    hands, feet, & lips  . Obesity   . Orthostatic hypotension    with prev tilt table test- 08/18/16- "have not passed out in years."  . Orthostatic hypotension   . Osteoarthritis   . Other B-complex deficiencies   . Peptic ulcer disease   . Peritoneal adhesions (postoperative) (postinfection)   . Sarcoidosis   .  Sarcoidosis   . Shortness of breath    exertion- due to chronic fatigue syndrome, sarcoid  . Thyroid disease    Unspecified hypothyroidism  . Unspecified intestinal obstruction   . UTI (lower urinary tract infection)    hx of  . Vitreous floaters, left   . Wart viral     Past Surgical History:  Procedure Laterality Date  . ANTERIOR CERVICAL DECOMP/DISCECTOMY FUSION N/A 10/16/2013   Procedure: Cervical three-four, Cervical five-six Anterior cervical decompression/diskectomy/fusion;  Surgeon: Barnett Abu, MD;  Location: [facility];  Service: Neurosurgery;  Laterality: N/A;  Cervical three-four, Cervical five-six Anterior cervical decompression/diskectomy/fusion  . ANTERIOR CERVICAL DECOMP/DISCECTOMY FUSION N/A 07/23/2014   Procedure: Cervical four-five Anterior cervical decompression/diskectomy/fusion;  Surgeon: Barnett Abu, MD;  Location: [facility];  Service: Neurosurgery;  Laterality: N/A;  C4-5 Anterior cervical decompression/diskectomy/fusion  . APPENDECTOMY    . BACK SURGERY  2015   herniated disc - cervical  . BELPHAROPTOSIS REPAIR Bilateral   . BIOPSY THYROID     benign  . CARPAL TUNNEL RELEASE Bilateral   . CATARACT EXTRACTION Bilateral 1998  . CHOLECYSTECTOMY  2006  . COLON SURGERY  1996   partial colon removal  . COLONOSCOPY W/ POLYPECTOMY    . Doppler arterial studies  05/13/2008   Both carotid systems demonstrate minimal plaque formation with normal flow velocities throughout.  Vertebrals demonstrate antegrade flow bilaterally.  Marland Kitchen  DOPPLER ECHOCARDIOGRAPHY  2010   Diastolic relaxation abnormality, mild AVSC, mild MR/TR and mild to mod AR  . ESOPHAGOGASTRODUODENOSCOPY    . HERNIA REPAIR  2005   Ventral  . LASIK  1998   Cataract  . LUNG BIOPSY    . LYMPH NODE DISSECTION     sternum  . MASS EXCISION Right 10/19/2016   Procedure: EXCISION DUBUYTRENS NODULE RIGHT MIDDLE FINGER;  Surgeon: Dairl PonderWeingold, Matthew, MD;  Location: [facility] OR;  Service: Orthopedics;  Laterality:  Right;  . NASAL SEPTUM SURGERY  2006  . NOSE SURGERY  2006  . Nuclear Test  08/2005   Normal (chest pain)  . PARS PLANA VITRECTOMY Left 08/20/2016  . PARS PLANA VITRECTOMY Left 08/20/2016   Procedure: PARS PLANA VITRECTOMY WITH 25 GAUGE;  Surgeon: Edmon Crapeankin, Gary A, MD;  [facility];  Service: Ophthalmology;  Laterality: Left;  . PARS PLANA VITRECTOMY Right 11/30/2017   Procedure: PARS PLANA VITRECTOMY WITH 25 GAUGE;  Surgeon: Edmon Crapeankin, Gary A, MD;  [facility];  Service: Ophthalmology;  Laterality: Right;  . PARTIAL COLECTOMY  1996   complicated by an abscess formation  . PHOTOCOAGULATION WITH LASER Right 11/30/2017   Procedure: PHOTOCOAGULATION WITH LASER;  Surgeon: Edmon Crapeankin, Gary A, MD;  [facility];  Service: Ophthalmology;  Laterality: Right;  . Right hip bursa removal  1991  . SBO  2002  . TENOLYSIS Right 10/19/2016   Procedure: RIGHT WRIST STENOSING TENOSYNOVITIS RELEASE ;  Surgeon: Dairl PonderWeingold, Matthew, MD;  [facility];  Service: Orthopedics;  Laterality: Right;  . TONSILLECTOMY    . TRIGGER FINGER RELEASE Right 10/19/2016   Procedure: RIGHT THUMB RELEASE TRIGGER FINGER/A-1 PULLEY;  Surgeon: Dairl PonderWeingold, Matthew, MD;  Location: [facility] OR;  Service: Orthopedics;  Laterality: Right;    Family History  Problem Relation Age of Onset  . Crohn's disease Mother   . Heart disease Father   . Hyperlipidemia Neg Hx        No history of early CAD  . Cancer Neg Hx        No history of lymphoma or leukemia    Social history:  reports that she has never smoked. She has never used smokeless tobacco. She reports that she does not drink alcohol or use drugs.    Allergies  Allergen Reactions  . Amoxicillin Rash and Other (See Comments)    PATIENT HAS HAD A PCN REACTION WITH IMMEDIATE RASH, FACIAL/TONGUE/THROAT SWELLING, SOB, OR LIGHTHEADEDNESS WITH HYPOTENSION:  #  #  YES  #  #  Has patient had a PCN reaction causing severe rash involving mucus membranes or skin necrosis: No Has patient  had a PCN reaction that required hospitalization: No Has patient had a PCN reaction occurring within the last 10 years: No If all of the above answers are "NO", then may proceed with Cephalosporin use.   . Other Other (See Comments)    Hepatotoxic Gases per MD request this is not to be used due to Chronic Fatigue Syndrome creates risk for Hepatitis.   . Esomeprazole Magnesium Other (See Comments)     Esophageal spasm  . Percocet [Oxycodone-Acetaminophen] Other (See Comments)    It "wires me" , I can't sleep    Medications:  Prior to Admission medications   Medication Sig Start Date End Date Taking? Authorizing Provider  acetaminophen (TYLENOL) 500 MG tablet Take 1,000 mg by mouth every 6 (six) hours as needed for moderate pain or headache.   Yes [provider]  B Complex-C-Folic  Acid (STRESS B COMPLEX PO) Take 1 tablet by mouth daily.   Yes [provider]  Biotin 10000 MCG TABS Take 10,000 mcg by 16109mouth daily.   Yes [provider]  CALCIUM PO Take 3,000 mg by mouth at bedtime. 1000 mg per tablet   Yes [provider]  cetirizine (ZYRTEC) 10 MG tablet Take 10 mg by mouth daily.   Yes [provider]  Chromium Picolinate 500 MCG CAPS Take 500 mcg by mouth daily.   Yes [provider]  Coenzyme Q10 (COQ10) 200 MG CAPS Take 200 mg by mouth daily. Co Q-10 & Cinnamon   Yes [provider]  gabapentin (NEURONTIN) 100 MG capsule Take 2 capsules (200 mg total) by mouth at bedtime. 02/02/18  Yes York SpanielWillis, Charles K, MD  Ginger, Zingiber officinalis, (GINGER ROOT) 550 MG CAPS Take 550 mg by mouth 3 (three) times daily.   Yes [provider]  Glucosamine-Chondroitin (COSAMIN DS PO) Take 1 tablet by mouth 3 (three) times daily.   Yes [provider]  ipratropium (ATROVENT) 0.06 % nasal spray Place 2 sprays into both nostrils daily as needed (for allergies.).    Yes [provider]  Magnesium 500 MG TABS Take 1,000  mg by mouth every evening.   Yes [provider]  Misc Natural Products (TART CHERRY ADVANCED) CAPS Take by mouth. Tart Cherry Turmeric Complex   Yes [provider]  Omega-3 Fatty Acids (OMEGA 3 PO) Take 1,360 mg by mouth daily.   Yes [provider]  Polyethyl Glycol-Propyl Glycol (SYSTANE) 0.4-0.3 % GEL ophthalmic gel Place 1 application into both eyes 2 (two) times daily as needed (dry eyes).   Yes [provider]  Potassium 99 MG TABS Take 495 mg by mouth at bedtime as needed (for additional potassium). May take an additional 99-198 mg as needed for electrolytes imbalance    Yes [provider]  potassium chloride SA (K-DUR,KLOR-CON) 20 MEQ tablet Take 1 tablet (20 mEq total) by mouth daily. Patient taking differently: Take 40 mEq by mouth every evening.    Yes Joaquim Namuncan, Graham S, MD  Probiotic Product (VSL#3) CAPS Take 1 capsule by mouth daily.   Yes [provider]  Selenium 200 MCG CAPS Take 200 mcg by mouth daily.    Yes [provider]  torsemide (DEMADEX) 10 MG tablet Take 20 mg by mouth every evening.    Yes [provider]  TURMERIC PO Take 800 mg by mouth 3 (three) times daily.   Yes [provider]  Zinc 50 MG TABS Take 50 mg by mouth every evening.   Yes [provider]    ROS:  Out of a complete 14 system review of symptoms, the patient complains only of the following symptoms, and all other reviewed systems are negative.  Hearing loss Chest pain, leg swelling Restless legs, insomnia, frequent waking, daytime sleepiness Aching muscles, muscle cramps, neck pain Itching Headache, numbness  Blood pressure 103/62, pulse 91, height 5\' 2"  (1.575 m), weight 238 lb (108 kg).   Blood pressure, right arm, sitting is 108/70.  Blood pressure, right arm, standing is 116/74.  Physical Exam  General: The patient is alert and cooperative at the time of the examination.  The patient is markedly  obese.  Skin: No significant peripheral edema is noted.   Neurologic Exam  Mental status: The patient is alert and oriented x 3 at the time of the examination. The patient has apparent normal recent and  remote memory, with an apparently normal attention span and concentration ability.   Cranial nerves: Facial symmetry is present. Speech is normal, no aphasia or dysarthria is noted. Extraocular movements are full. Visual fields are full.  Motor: The patient has good strength in all 4 extremities.  Sensory examination: Soft touch sensation is symmetric on the face, arms, and legs.  Coordination: The patient has good finger-nose-finger and heel-to-shin bilaterally.  Gait and station: The patient has a normal gait. Romberg is negative. No drift is seen.  Reflexes: Deep tendon reflexes are symmetric.   MRI cervical 11/20/17:  IMPRESSION: This MRI of the cervical spine without contrast shows the following: 1. The spinal cord appears normal. 2. Remote C3-C6 fusion. 3. Disc protrusion at C2-C3 that does not cause spinal stenosis or nerve root compression 4. Disc bulging C6-C7 that does not cause spinal stenosis or nerve root compression. 5. There are no changes compared to the 05/03/2016 MRI.  * MRI scan images were reviewed online. I agree with the written report.   MRI brain 11/03/17:  IMPRESSION:   MRI brain (without). No acute findings. - Minimal scattered periventricular and subcortical and pontine chronic small vessel ischemic disease. - No acute findings. - No change from 11/03/16.    Assessment/Plan:  1.  Morbid obesity  2.  Cervicogenic headache  The patient will continue on the 100 mg dosing of gabapentin at night.  I have recommended that she try hemp oil to see if this helps the sleep issues and the neck pain.  The patient will follow-up in 6 months.  Marlan Palau MD 02/08/2018 1:48 PM  [facility] [address] [place], [place] [address]  Phone [phone] Fax [phone]

## 2018-06-02 ENCOUNTER — Telehealth: Payer: Self-pay | Admitting: Neurology

## 2018-06-02 MED ORDER — DEXAMETHASONE 2 MG PO TABS: ORAL_TABLET | ORAL | 0 refills | Status: DC

## 2018-06-02 NOTE — Addendum Note (Signed)
Addended by: York Spaniel on: 06/02/2018 04:53 PM   Modules accepted: Orders

## 2018-06-02 NOTE — Telephone Encounter (Signed)
I called the patient.  The patient has had a bifrontal headache over the last week or so, she has had these headaches off and on over the last several months, she had a similar headache in October 2019.  Her usual headaches are cervicogenic headache in the back of the head.  I will try a 3-day course of Decadron.  The patient may try a decongestant medication as this headache could potentially be related to sinuses.

## 2018-06-02 NOTE — Telephone Encounter (Signed)
I contacted the pt and she states she is currently having a "horrific" headache. She states the pain in across her eyes and her balance has been effected.  Pt reports no falls but reports the headache has been present over the past month. Pt reports she has been treating with OTC Tylenol extra strength without benefit.   Best call back # is [phone].

## 2018-06-02 NOTE — Telephone Encounter (Signed)
Pt is asking to be called so she can discuss the type headache she is having which is affecting her balance.  Please call

## 2018-07-04 ENCOUNTER — Encounter: Payer: Self-pay | Admitting: Emergency Medicine

## 2018-07-04 ENCOUNTER — Ambulatory Visit (INDEPENDENT_AMBULATORY_CARE_PROVIDER_SITE_OTHER): Payer: Medicare Other | Admitting: Emergency Medicine

## 2018-07-04 ENCOUNTER — Other Ambulatory Visit: Payer: Self-pay

## 2018-07-04 VITALS — BP 116/72 | HR 67 | Ht 62.0 in | Wt 233.0 lb

## 2018-07-04 DIAGNOSIS — D869 Sarcoidosis, unspecified: Secondary | ICD-10-CM | POA: Diagnosis not present

## 2018-07-04 DIAGNOSIS — G4730 Sleep apnea, unspecified: Secondary | ICD-10-CM | POA: Diagnosis not present

## 2018-07-04 DIAGNOSIS — J301 Allergic rhinitis due to pollen: Secondary | ICD-10-CM | POA: Diagnosis not present

## 2018-07-04 DIAGNOSIS — R0609 Other forms of dyspnea: Secondary | ICD-10-CM | POA: Insufficient documentation

## 2018-07-04 DIAGNOSIS — R0600 Dyspnea, unspecified: Secondary | ICD-10-CM | POA: Insufficient documentation

## 2018-07-04 NOTE — Assessment & Plan Note (Signed)
Discussed with her, she is not interested in polysomnogram or treatment for this.

## 2018-07-04 NOTE — Assessment & Plan Note (Addendum)
Associated with chest discomfort.  Completely agree with cardiac evaluation as planned.  Her CT-PA did not show any evidence of pulmonary embolism, may have been some subtle evidence for groundglass or pneumonitis.  I will obtain a copy and review.  She has never had parenchymal disease from her sarcoidosis in the past.  Interestingly her symptoms seem to worsen when she was on dexamethasone which would be inconsistent with a sarcoid flare. Certainly restriction and obesity are playing a role here.  Question contribution of GERD and obstructive lung disease.  She needs pulmonary function testing to compare with 2019.  We will review her CT.  Check ACE level to assess for possible active sarcoid.  She is at risk for pulmonary hypertension given her probable obstructive sleep apnea, sarcoid.  Discussed possible PSG with her today and she declined.

## 2018-07-04 NOTE — Assessment & Plan Note (Signed)
She has difficulty tolerating medications, is not currently on therapy.  Discussed possibly starting loratadine

## 2018-07-04 NOTE — Addendum Note (Signed)
Addended by: Suzzanne Cloud E on: 07/04/2018 02:07 PM   Modules accepted: Orders

## 2018-07-04 NOTE — Progress Notes (Signed)
Subjective:    Patient ID: Angela Dudley, female    DOB: 12/12/1945, 73 y.o.   MRN: [id number]  HPI 73 year old never smoker with a history of GERD with hiatal hernia, allergic rhinitis, fibromyalgia, chronic fatigue syndrome and sarcoidosis that was diagnosed by mediastinoscopy in 1994. She has been seen [facility], most recently by Dr Su MonksEjaz at [facility].   She has been seen by her PCP, evaluated for SOB and chest discomfort, pressure in May 2020. States that she had been well, then received dexamethasone for joint pain, subsequently developed chest symptoms.  She was evaluated by Dr Leeann Mustenaldo w cardiology, is planning for a cardiac stress test this week. She is having PND, associated w cough - on no meds. She has persistent GERD sx. She still has exertional SOB, able to exert for about 20 min and then has to rest. She does not want to be evaluated for OSA.   The eval for CP included CT-PA that is described below.   [facility] reviewed -  Normal airflows, F/V loop unavailable, ? Mixed disease.   [facility].  The films have been sent to us and report is available.  This showed no evidence of PE, patchy very mild groundglass infiltrate noted bilaterally with some basilar atelectasis.  There was no pneumothorax, mass, effusion or consolidation.  Question evidence for pneumonitis.   Review of Systems  Constitutional: Negative for fever and unexpected weight change.  HENT: Negative for congestion, dental problem, ear pain, nosebleeds, postnasal drip, rhinorrhea, sinus pressure, sneezing, sore throat and trouble swallowing.   Eyes: Negative for redness and itching.  Respiratory: Negative for cough, chest tightness, shortness of breath and wheezing.   Cardiovascular: Negative for palpitations and leg swelling.  Gastrointestinal: Negative for nausea and vomiting.  Genitourinary: Negative for dysuria.  Musculoskeletal: Negative for joint swelling.  Skin:  Negative for rash.  Neurological: Negative for headaches.  Hematological: Does not bruise/bleed easily.  Psychiatric/Behavioral: Negative for dysphoric mood. The patient is not nervous/anxious.     Past Medical History:  Diagnosis Date  . Abdominal pain, generalized   . Allergy   . Anemia   . Anxiety    pt denies  . Back pain   . Cervicogenic headache 06/08/2016  . Chronic fatigue syndrome   . Cramps, muscle, general   . Degenerative disc disease   . Diverticulosis of colon (without mention of hemorrhage)   . Dizziness   . Esophageal hernia   . Fever   . Fibromyalgia   . Frequent urination at night   . GERD (gastroesophageal reflux disease)    under control  . H/O hiatal hernia   . Headache(784.0)   . Hemorrhoids   . Hyperlipidemia   . Hypothyroidism    no medication- endrocnologist said she never had it  . IBS (irritable bowel syndrome)   . Idiopathic peripheral autonomic neuropathy   . Numbness and tingling    hands, feet, & lips  . Obesity   . Orthostatic hypotension    with prev tilt table test- 08/18/16- "have not passed out in years."  . Orthostatic hypotension   . Osteoarthritis   . Other B-complex deficiencies   . Peptic ulcer disease   . Peritoneal adhesions (postoperative) (postinfection)   . Sarcoidosis   . Sarcoidosis   . Shortness of breath    exertion- due to chronic fatigue syndrome, sarcoid  . Thyroid disease    Unspecified hypothyroidism  .  Unspecified intestinal obstruction   . UTI (lower urinary tract infection)    hx of  . Vitreous floaters, left   . Wart viral      Family History  Problem Relation Age of Onset  . Crohn's disease Mother   . Heart disease Father   . Hyperlipidemia Neg Hx        No history of early CAD  . Cancer Neg Hx        No history of lymphoma or leukemia     Social History   Socioeconomic History  . Marital status: Widowed    Spouse name: Not on file  . Number of children: 0  . Years of education: BS  .  Highest education level: Not on file  Occupational History  . Occupation: Former, Therapist, sports, Retired  Scientific laboratory technician  . Financial resource strain: Not on file  . Food insecurity    Worry: Not on file    Inability: Not on file  . Transportation needs    Medical: Not on file    Non-medical: Not on file  Tobacco Use  . Smoking status: Never Smoker  . Smokeless tobacco: Never Used  Substance and Sexual Activity  . Alcohol use: No  . Drug use: No  . Sexual activity: Not on file  Lifestyle  . Physical activity    Days per week: Not on file    Minutes per session: Not on file  . Stress: Not on file  Relationships  . Social Herbalist on phone: Not on file    Gets together: Not on file    Attends religious service: Not on file    Active member of club or organization: Not on file    Attends meetings of clubs or organizations: Not on file    Relationship status: Not on file  . Intimate partner violence    Fear of current or ex partner: Not on file    Emotionally abused: Not on file    Physically abused: Not on file    Forced sexual activity: Not on file  Other Topics Concern  . Not on file  Social History Narrative   Lives alone   Regular exericse:  Yes.  [employer] for a hobby.   Has not been sexually active since lost husband.    No hx of TB exposure.   She has 2 dogs at home and 5 cats.   Caffeine use: One soft drink daily     Allergies  Allergen Reactions  . Amoxicillin Rash and Other (See Comments)    PATIENT HAS HAD A PCN REACTION WITH IMMEDIATE RASH, FACIAL/TONGUE/THROAT SWELLING, SOB, OR LIGHTHEADEDNESS WITH HYPOTENSION:  #  #  YES  #  #  Has patient had a PCN reaction causing severe rash involving mucus membranes or skin necrosis: No Has patient had a PCN reaction that required hospitalization: No Has patient had a PCN reaction occurring within the last 10 years: No If all of the above answers are "NO", then may proceed with Cephalosporin use.   . Other Other  (See Comments)    Hepatotoxic Gases per MD request this is not to be used due to Chronic Fatigue Syndrome creates risk for Hepatitis.   . Esomeprazole Magnesium Other (See Comments)     Esophageal spasm  . Percocet [Oxycodone-Acetaminophen] Other (See Comments)    It "wires me" , I can't sleep     Outpatient Medications Prior to Visit  Medication Sig Dispense Refill  .  acetaminophen (TYLENOL) 500 MG tablet Take 1,000 mg by mouth every 6 (six) hours as needed for moderate pain or headache.    . B Complex-C-Folic Acid (STRESS B COMPLEX PO) Take 1 tablet by mouth daily.    . Biotin 1610910000 MCG TABS Take 10,000 mcg by mouth daily.    Marland Kitchen. CALCIUM PO Take 3,000 mg by mouth at bedtime. 1000 mg per tablet    . cetirizine (ZYRTEC) 10 MG tablet Take 10 mg by mouth daily.    . Chromium Picolinate 500 MCG CAPS Take 500 mcg by mouth daily.    . Coenzyme Q10 (COQ10) 200 MG CAPS Take 200 mg by mouth daily. Co Q-10 & Cinnamon    . gabapentin (NEURONTIN) 100 MG capsule Take 1 capsule (100 mg total) by mouth at bedtime. 60 capsule 1  . Ginger, Zingiber officinalis, (GINGER ROOT) 550 MG CAPS Take 550 mg by mouth 3 (three) times daily.    . Glucosamine-Chondroitin (COSAMIN DS PO) Take 1 tablet by mouth 3 (three) times daily.    Marland Kitchen. ipratropium (ATROVENT) 0.06 % nasal spray Place 2 sprays into both nostrils daily as needed (for allergies.).     Marland Kitchen. Magnesium 500 MG TABS Take 1,000 mg by mouth every evening.    . Omega-3 Fatty Acids (OMEGA 3 PO) Take 1,360 mg by mouth daily.    Bertram Gala. Polyethyl Glycol-Propyl Glycol (SYSTANE) 0.4-0.3 % GEL ophthalmic gel Place 1 application into both eyes 2 (two) times daily as needed (dry eyes).    . Potassium 99 MG TABS Take 495 mg by mouth at bedtime as needed (for additional potassium). May take an additional 99-198 mg as needed for electrolytes imbalance     . potassium chloride SA (K-DUR,KLOR-CON) 20 MEQ tablet Take 1 tablet (20 mEq total) by mouth daily. (Patient taking differently:  Take 40 mEq by mouth every evening. ) 90 tablet 1  . Selenium 200 MCG CAPS Take 200 mcg by mouth daily.     Marland Kitchen. torsemide (DEMADEX) 10 MG tablet Take 20 mg by mouth every evening.     . TURMERIC PO Take 800 mg by mouth 3 (three) times daily.    . Zinc 50 MG TABS Take 50 mg by mouth every evening.    . Misc Natural Products (TART CHERRY ADVANCED) CAPS Take by mouth. Tart Cherry Turmeric Complex    . Probiotic Product (VSL#3) CAPS Take 1 capsule by mouth daily.    Marland Kitchen. dexamethasone (DECADRON) 2 MG tablet Take 3 tablets the first day, 2 the second and 1 the third day 6 tablet 0   No facility-administered medications prior to visit.         Objective:   Physical Exam  Vitals:   07/04/18 1322  BP: 116/72  Pulse: 67  SpO2: 99%  Weight: 233 lb (105.7 kg)  Height: 5\' 2"  (1.575 m)   Gen: Pleasant obese woman, in no distress,  normal affect  ENT: No lesions,  mouth clear,  oropharynx clear, no postnasal drip  Neck: No JVD, no stridor  Lungs: No use of accessory muscles, small breaths, no crackles or wheezes.   Cardiovascular: RRR, heart sounds normal, no murmur or gallops, no peripheral edema  Musculoskeletal: No deformities, no cyanosis or clubbing  Neuro: alert, awake, non focal  Skin: Warm, no lesions or rash     Assessment & Plan:  Sleep apnea in adult Discussed with her, she is not interested in polysomnogram or treatment for this.  Allergic rhinitis She has difficulty  tolerating medications, is not currently on therapy.  Discussed possibly starting loratadine  Dyspnea on exertion Associated with chest discomfort.  Completely agree with cardiac evaluation as planned.  Her CT-PA did not show any evidence of pulmonary embolism, may have been some subtle evidence for groundglass or pneumonitis.  I will obtain a copy and review.  She has never had parenchymal disease from her sarcoidosis in the past.  Interestingly her symptoms seem to worsen when she was on dexamethasone which  would be inconsistent with a sarcoid flare. Certainly restriction and obesity are playing a role here.  Question contribution of GERD and obstructive lung disease.  She needs pulmonary function testing to compare with 2019.  We will review her CT.  Check ACE level to assess for possible active sarcoid.  She is at risk for pulmonary hypertension given her probable obstructive sleep apnea, sarcoid.  Discussed possible PSG with her today and she declined.  SARCOIDOSIS Today it does not look like she ever had significant parenchymal disease, did have significant lymphadenopathy.  Notes indicate possible associated factor disease but her most [facility] did not identify significant obstruction.  I think she needs repeat PFT, ACE level as described above.  Levy Pupaobert Chivas Notz, MD, PhD 07/04/2018, 2:01 PM [facility] [phone] or if no answer [phone]

## 2018-07-04 NOTE — Patient Instructions (Signed)
We will check blood work today. You may want to try starting loratadine 10 mg (generic Claritin) once daily to help with your congestion.  This and your esophageal reflux are probably contributing to your cough. We will perform pulmonary function testing at your next office visit. We will obtain a copy of your CT chest that was done 06/23/2018. Get your cardiac stress test as planned, we will review this next time.  Consider echocardiogram going forward if not already done. Follow with Dr. Lamonte Sakai next available with full pulmonary function testing on the same day.

## 2018-07-04 NOTE — Assessment & Plan Note (Signed)
Today it does not look like she ever had significant parenchymal disease, did have significant lymphadenopathy.  Notes indicate possible associated factor disease but her most recent pulmonary function testing from [facility] did not identify significant obstruction.  I think she needs repeat PFT, ACE level as described above.

## 2018-07-06 LAB — ANGIOTENSIN CONVERTING ENZYME: Angiotensin-Converting Enzyme: 26 U/L (ref 9–67)

## 2018-07-07 ENCOUNTER — Other Ambulatory Visit: Payer: Self-pay | Admitting: Neurology

## 2018-07-15 ENCOUNTER — Institutional Professional Consult (permissible substitution): Payer: Medicare Other | Admitting: Internal Medicine

## 2018-07-18 ENCOUNTER — Telehealth: Payer: Self-pay | Admitting: Emergency Medicine

## 2018-07-18 NOTE — Telephone Encounter (Signed)
Call returned to patient made aware of lab results per BM. Made aware we are still awaiting a response from [facility]. Made aware he is still seeing patients and she may not get a call back until AM. Patient voices she is okay waiting.

## 2018-07-18 NOTE — Telephone Encounter (Signed)
Called & spoke w/ pt. Pt is requesting her Angiotensin converting enzyme lab results from 07/04/2018. I let her know that RB has not yet resulted these, but that I would route it to the provider of the day for follow-up. Pt expressed understanding with additional questions:  1) Pt is requesting recommendations for her chronic cough. She states she had a stress test recently which ruled out that it is not cardiac related. She reports having upper chest pain and is not currently taking any medication or therapy for the cough.   2) Pt has a question about [place] coronavirus cases. She states that she has been doing some research and finds that [facility] has one of the highest number of death to patient ratio and she is wondering why.    Routing to one of the providers of the day. Aaron Edelman, please advise with your results and recommendations. Thank you.

## 2018-07-18 NOTE — Telephone Encounter (Signed)
07/18/2018 1704  07/04/2018-ACE level-26  The ACE level is normal.  Will route rest of the note to Dr. Lamonte Sakai who is in office today for further follow-up regarding chronic cough since he has seen the patient before.  I would assume the question regarding  coronavirus cases is due to the fact that we have initially not been testing high amount of patients.  Still with our denominator being lower and initially gives a false sense of a higher number of deaths the patient ratio.  Also we typically get [place] from surrounding [place] areas as those hospitals are not managing them.  So with having a COVID hospital such as [facility] I would assume that her numbers would automatically be slightly higher due to the increase patient population.  Wyn Quaker, FNP

## 2018-07-20 NOTE — Telephone Encounter (Signed)
Dr. Lamonte Sakai, please advise on this for pt. Thanks!

## 2018-07-21 ENCOUNTER — Telehealth: Payer: Self-pay | Admitting: Emergency Medicine

## 2018-07-21 NOTE — Telephone Encounter (Signed)
Primary Pulmonologist: RB Last office visit and with whom: 07/04/2018 with RB What do we see them for (pulmonary problems): sarcoidosis Last OV assessment/plan: Instructions  We will check blood work today. You may want to try starting loratadine 10 mg (generic Claritin) once daily to help with your congestion.  This and your esophageal reflux are probably contributing to your cough. We will perform pulmonary function testing at your next office visit. We will obtain a copy of your CT chest that was done 06/23/2018. Get your cardiac stress test as planned, we will review this next time.  Consider echocardiogram going forward if not already done. Follow with Dr. Lamonte Sakai next available with full pulmonary function testing on the same day.      Was appointment offered to patient (explain)?  Pt requesting to have meds prescribed   Reason for call: Called and spoke with pt who stated pt has noticed worsening breathing today. Pt stated that she has been coughing and has discomfort in chest due to it as well.  Pt states her cough is a dry cough.  Pt states her cough is not constant but when she coughs, she may cough about 10 minutes before she can stop coughing and due to the length of time, she has had chest discomfort. Pt stated when she had consult with RB 6/29, she had told him about her issues with SOB and states it has just become worse.  Pt is currently not on any inhalers and states she has not also tried any OTC meds.  Pt stated again that today has been the first day that she has been having problems with her breathing as she states even at rest she is having problems with her breathing.  Pt denies any complaints of fever as she just went to the doctor today and they did not tell her she had a temp. Pt denies any complaints of loss of smell, no headache or weakness, no diarrhea, muscle pain, or rash, no sore throat, no nausea/vomiting. Pt has not had to be tested for COVID for any reasons and  has not done any travelling.  Pt would like to know what we recommend to help with her breathing and is wondering if an inhaler might be able to be prescribed for her.  Tonya, please advise on this for pt. Thanks!

## 2018-07-21 NOTE — Telephone Encounter (Signed)
Called and spoke with pt letting her know we needed to get her scheduled for a televisit tomorrow with APP and she verbalized understanding. Pt has been scheduled for televisit tomorrow, 7/17 with Aaron Edelman at 11am. Nothing further needed.

## 2018-07-21 NOTE — Telephone Encounter (Signed)
Please schedule e-visit with Dr. Lamonte Sakai or an APP tomorrow afternoon. Thanks.

## 2018-07-21 NOTE — Progress Notes (Signed)
Virtual Visit via Telephone Note  I connected with Angela Dudley on 07/22/18 at 11:00 AM EDT by telephone and verified that I am speaking with the correct person using two identifiers.  Location: Patient: Home Provider: [facility] - [address], [place], [place], [place] [address]   I discussed the limitations, risks, security and privacy concerns of performing an evaluation and management service by telephone and the availability of in person appointments. I also discussed with the patient that there may be a patient responsible charge related to this service. The patient expressed understanding and agreed to proceed.  Patient consented to consult via telephone: Yes People present and their role in pt care: Pt    History of Present Illness: 73 year old female never smoker initially consulted with our practice in June/2020.  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  Patient has been seen at [facility] and also [facility] (Dr. Camillo Flaming) Past medical history: GERD with hiatal hernia, allergic rhinitis, fibromyalgia, chronic fatigue syndrome Smoking history: Never smoker Maintenance: None Patient of Dr. Lamonte Sakai  Chief complaint: Cough   73 year old female never smoker followed in our office for cough.  Patient with a remote history of sarcoidosis.  Patient has never been managed on long-term prednisone or steroid sparing immunosuppressants for sarcoidosis.  Patient has been followed by [facility] as well as [facility].  Most recently at [facility] in 2019 they did not feel the patient sarcoid was active and instead felt the patient's shortness of breath was related to fluid overload/orthopnea and recommended an echocardiogram.  Also had suggested in the 2019 office note the patient may have asthma.  PFTs at [facility] were unremarkable.  Patient was consulted and saw Dr. Lamonte Sakai on 07/04/2018.  At that office visit Dr. Lamonte Sakai discussed we may need to get  the patient scheduled for sleep study which she refused.  Patient also had pulmonary function tests ordered at the 07/04/2018 office visit.  Patient is frustrated today that she has not heard back and been scheduled for these pulmonary function test yet.  Patient continues to have a dry cough.  She reports that she is having chest pain from coughing so hard.  She also reports that her shortness of breath is worsened since her office visit with Dr. Lamonte Sakai.  She reports that she does have swollen feet but this is her baseline.  Her close do not fit any different than what her baseline would be.  Her weight has been stable.  Weight today is 234 pounds.  Patient is short of breath when lying flat.  Patient feels that she may need to use an inhaler.  She wants to know if she can use 1 of the inhalers at home.  While waiting on the phone with the patient for her to find her inhalers she had to rescue inhalers as well as 1 Symbicort inhaler all of which were expired.  Patient is unsure when these were prescribed for her.  She admits she does not have any wheezing today.  Patient has completed an echocardiogram in 2019 at [facility] but we do not have records of this.  They are not in care everywhere.  We also can see the imaging report from her CTA chest in June/2020 but we do not have the actual CT images.  Patient recently talked to a surgeon over the phone and she is been having issues with nausea on 07/21/2018 per the patient.  Patient reports that her surgeon  thought that she should be tested for SARS-CoV-2 based off her worsened symptoms.    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 which impinges on right frontal sinus drainage pathway, moderate size cigar nasally cell on the left which impinges on the left frontal sinus drainage pathway, right to left deviation of nasal septum  04/13/2017-CT chest without contrast- no acute findings are noted in the thorax to account for the patient's symptoms, scattered areas of scarring and subsegmental atelectasis are noted in the lungs bilaterally most evident throughout lower lungs, aortic arthrosclerosis, moderate sized hiatal hernia  03/15/2017- pulmonary function test- FVC 2.05 (77% predicted), postbronchodilator ratio 80, postbronchodilator FEV1 postbronchodilator 1.58 (76% predicted), DLCO 90  Assessment and Plan:  Sleep apnea in adult Plan: Our office is recommended a sleep study, the patient has declined Our recommendation still stands for her to complete a sleep study  SARCOIDOSIS Assessment: Biopsy-proven sarcoid from 1994 No recent sarcoid flares, per patient she has never had a sarcoid flare 2019 [facility] did not find her sarcoid active June/2020 ACE level 26 Patient with worsening dry cough and worsening shortness of breath, patient reports she is intolerant of steroids   Plan: Complete pulmonary function test as previously ordered Obtain chest x-ray at next in office visit  Explained to the patient multiple times that there has been a delay in pulmonary function test as we are backed up with testing as well as test now will require COVID testing prior, as well as we are only able to operate at half capacity due to COVID restrictions and cleaning guidelines.  Explained to the patient that when there is an opening we will be able to get the patient into coordinate the test.  I will follow-up with our office team today to see how quickly we can get the patient in.  Addendum: I was able to coordinate with our office staff to get the patient in for a pulmonary  function test on 07/26/2018 at 9 AM which then patient could have been seen by Dr. Delton CoombesByrum on the same day at 10:45 AM.  Patient communicated to our office staff that she is unable to complete morning appointments.  So she reported that she will not be able to make this work for her schedule.  Dyspnea on exertion Discussion: Differential for dyspnea on exertion is varied.  Patient with biopsy-proven history of sarcoidosis so could potentially be sarcoid flare difficult to fully assess telephonically.  Offered course of steroids but patient reports that she is intolerant of steroids.  Patient also feels that she is having "bronchitis-like symptoms".  Patient is not having a productive cough.  Offered patient course of antibiotics while we do outpatient COVID testing.  She declined this as well.  We attempted to coordinate getting the patient in for pulmonary function test on 07/26/2018 at 9 AM patient reported that she cannot do morning appointments that this would not work for her.  Patient is also noncommittal on completing outpatient COVID testing.  Plan: Complete pulmonary function test as outlined We have offered outpatient COVID testing which patient declined We offered course of steroids for potential sarcoid flare, patient declined We offered  course of antibiotics for potential bronchitis flare, patient declined We need to try to obtain the [facility] imaging records from patient's CTA in June/2020 We need to try to obtain the [facility] echocardiogram records  Suspected Covid-19 Virus Infection Assessment: Worsening shortness of breath over the past couple of weeks  Plan: Offered outpatient COVID testing to patient, she declined she reports that she needs to have testing [facility] Of[facility] quicker turnaround COVID test which would need to be completed prior to pulmonary function test that we tried to coordinate on 07/26/2018, patient declined doing pulmonary function test Patient needs to  contact her office if she decides to proceed forward with COVID testing so we can place the order and coordinate [facility] [place] [facility]  After completing the tele-visit with the patient I have also taken the time to coordinate care with Dr. Delton CoombesByrum.  I contacted Dr. Delton CoombesByrum directly regarding the patient's current symptoms as well as current work-up.  Dr. Delton CoombesByrum explained that he would like the patient to complete the pulmonary function test, obtain COVID testing, and see him after completing.  We were able to get the patient scheduled for a pulmonary function test on 07/26/2018 where then the patient can see Dr. Delton CoombesByrum after that she declined as this is a morning appointment and she said she cannot tolerate coming in for morning appointment.  We are also going to get a chest x-ray at that time to check for any symptoms of active sarcoid.   Follow Up Instructions:  Return in about 1 week (around 07/29/2018), or if symptoms worsen or fail to improve, for Follow up with Dr. Delton CoombesByrum, Follow up for PFT.   I discussed the assessment and treatment plan with the patient. The patient was provided an opportunity to ask questions and all were answered. The patient agreed with the plan and demonstrated an understanding of the instructions.   The patient was advised to call back or seek an in-person evaluation if the symptoms worsen or if the condition fails to improve as anticipated.  I provided 45 minutes of non-face-to-face time during this encounter.   Coral CeoBrian P Mack, NP

## 2018-07-22 ENCOUNTER — Ambulatory Visit (INDEPENDENT_AMBULATORY_CARE_PROVIDER_SITE_OTHER): Payer: Medicare Other | Admitting: Pulmonary Disease

## 2018-07-22 ENCOUNTER — Encounter: Payer: Self-pay | Admitting: Pulmonary Disease

## 2018-07-22 ENCOUNTER — Other Ambulatory Visit: Payer: Self-pay

## 2018-07-22 ENCOUNTER — Telehealth: Payer: Self-pay | Admitting: Emergency Medicine

## 2018-07-22 DIAGNOSIS — Z20828 Contact with and (suspected) exposure to other viral communicable diseases: Secondary | ICD-10-CM

## 2018-07-22 DIAGNOSIS — D869 Sarcoidosis, unspecified: Secondary | ICD-10-CM | POA: Diagnosis not present

## 2018-07-22 DIAGNOSIS — R0609 Other forms of dyspnea: Secondary | ICD-10-CM | POA: Diagnosis not present

## 2018-07-22 DIAGNOSIS — R0600 Dyspnea, unspecified: Secondary | ICD-10-CM

## 2018-07-22 DIAGNOSIS — G4730 Sleep apnea, unspecified: Secondary | ICD-10-CM

## 2018-07-22 DIAGNOSIS — R05 Cough: Secondary | ICD-10-CM

## 2018-07-22 DIAGNOSIS — R059 Cough, unspecified: Secondary | ICD-10-CM

## 2018-07-22 DIAGNOSIS — Z20822 Contact with and (suspected) exposure to covid-19: Secondary | ICD-10-CM

## 2018-07-22 NOTE — Assessment & Plan Note (Addendum)
Discussion: Differential for dyspnea on exertion is varied.  Patient with biopsy-proven history of sarcoidosis so could potentially be sarcoid flare difficult to fully assess telephonically.  Offered course of steroids but patient reports that she is intolerant of steroids.  Patient also feels that she is having "bronchitis-like symptoms".  Patient is not having a productive cough.  Offered patient course of antibiotics while we do outpatient COVID testing.  She declined this as well.  We attempted to coordinate getting the patient in for pulmonary function test on 07/26/2018 at 9 AM patient reported that she cannot do morning appointments that this would not work for her.  Patient is also noncommittal on completing outpatient COVID testing.  Plan: Complete pulmonary function test as outlined We have offered outpatient COVID testing which patient declined We offered course of steroids for potential sarcoid flare, patient declined We offered course of antibiotics for potential bronchitis flare, patient declined We need to try to obtain the [facility] imaging records from patient's CTA in June/2020 We need to try to obtain the [facility] echocardiogram records

## 2018-07-22 NOTE — Patient Instructions (Addendum)
We offered course of antibiotics which you declined We offered outpatient COVID testing which declined We offered an empiric rescue inhaler for you to try to help with your shortness of breath you declined We offered a course of steroids which she declined  We tried to get you set up for a one-week follow-up with a pulmonary function test, COVID testing as well as follow-up in office with Dr. Lamonte Sakai, you declined   Please complete pulmonary function testing as previously explained   Can use over-the-counter measures to help with cough such as Delsym or Mucinex DM  If you change your mind and you are interested in a course of antibiotics, Tessalon Perles for cough, outpatient COVID testing, a prescription for rescue inhaler, or a short course of steroids please contact our office so we can discuss and coordinate   Patient Education for Fluid Management   Do the following things EVERY DAY:   1. Weigh yourself EVERY morning after you go to the bathroom but before you eat or drink anything. Write this number down in a weight log/diary.    2. Take your medicines as prescribed. If you have concerns about your medications, please call us before you stop taking them.    3. Eat low salt foods-Limit salt (sodium) to 2000 mg per day. This will help prevent your body from holding onto fluid. Read food labels as many processed foods have a lot of sodium, especially canned goods and prepackaged meats. If you would like some assistance choosing low sodium foods, we would be happy to set you up with a nutritionist.   4. Stay as active as you can everyday. Staying active will give you more energy and make your muscles stronger. Start with 5 minutes at a time and work your way up to 30 minutes a day. Break up your activities--do some in the morning and some in the afternoon. Start with 3 days per week and work your way up to 5 days as you can.  If you have chest pain, feel short of breath, dizzy, or  lightheaded, STOP. If you don't feel better after a short rest, call 911. If you do feel better, call the office to let us know you have symptoms with exercise.   5. Limit all fluids for the day to less than 2 liters. Fluid includes all drinks, coffee, juice, ice chips, soup, jello, and all other liquids.   Return in about 1 week (around 07/29/2018), or if symptoms worsen or fail to improve, for Follow up with Dr. Lamonte Sakai, Follow up for PFT.

## 2018-07-22 NOTE — Assessment & Plan Note (Signed)
Assessment: Worsening shortness of breath over the past couple of weeks  Plan: [place] testing to patient, she declined she reports that she needs to have testing in [facility] quicker turnaround COVID test which would need to be completed prior to pulmonary function test that we tried to coordinate on 07/26/2018, patient declined doing pulmonary function test Patient needs to contact her office if she decides to proceed forward with COVID testing so we can place the order and coordinate testing in [facility] at [facility]

## 2018-07-22 NOTE — Telephone Encounter (Signed)
Addressed by Tonia Brooms and another phone note.

## 2018-07-22 NOTE — Assessment & Plan Note (Addendum)
Assessment: Biopsy-proven sarcoid from 1994 No recent sarcoid flares, per patient she has never had a sarcoid flare 2019 [facility] work-up did not find her sarcoid active June/2020 ACE level 26 Patient with worsening dry cough and worsening shortness of breath, patient reports she is intolerant of steroids   Plan: Complete pulmonary function test as previously ordered Obtain chest x-ray at next in office visit  Explained to the patient multiple times that there has been a delay in pulmonary function test as we are backed up with testing as well as test now will require COVID testing prior, as well as we are only able to operate at half capacity due to COVID restrictions and cleaning guidelines.  Explained to the patient that when there is an opening we will be able to get the patient into coordinate the test.  I will follow-up with our office team today to see how quickly we can get the patient in.  Addendum: I was able to coordinate with our office staff to get the patient in for a pulmonary function test on 07/26/2018 at 9 AM which then patient could have been seen by Dr. Lamonte Sakai on the same day at 10:45 AM.  Patient communicated to our office staff that she is unable to complete morning appointments.  So she reported that she will not be able to make this work for her schedule.

## 2018-07-22 NOTE — Assessment & Plan Note (Signed)
Plan: Our office is recommended a sleep study, the patient has declined Our recommendation still stands for her to complete a sleep study

## 2018-07-22 NOTE — Telephone Encounter (Signed)
Called and spoke to patient about scheduling COVID testing prior to PFT.  Patient was upset that the PFT was scheduled for 0900 and stated that she can not do anything early in the mornings because she doesn't sleep well at night.  Patient stated that the NP didn't tell her she would need to do anything in the mornings.  Wyn Quaker, NP came into the room and reported that Dr. Lamonte Sakai would like patient to have COVID testing, PFT, and chest x-ray prior to office visit with him.  Patient was upset with that information and stated that she want to just see Dr. Lamonte Sakai without any of the other things.  Advised her of RB's recommendations.  Patient stated she would cancel PFT and OV appointments and would like to reschedule at a later date for a later time of day. Patient also stated she only wants to see Dr. Lamonte Sakai and not any of the NPs.   Routing to RB as FYI.

## 2018-07-22 NOTE — Telephone Encounter (Signed)
Thank you :)

## 2018-07-22 NOTE — Telephone Encounter (Signed)
07/22/2018 1641  AVS to be mailed to patient today to contact our office if she changes her mind regarding pulmonary function testing and getting scheduled sooner.  We may need to consider referral to additional pulmonologist such as Dr. Melvyn Novas who is in office full-time.  It looks like the next closest availability for an afternoon office visit with Dr. Lamonte Sakai as well as a pulmonary function test would not be until 08/24/2018 per PR.   Let me know if there is anything I can do to help.  Thank you both for your help today.  Wyn Quaker, FNP

## 2018-07-26 ENCOUNTER — Ambulatory Visit: Payer: Medicare Other | Admitting: Emergency Medicine

## 2018-08-01 ENCOUNTER — Institutional Professional Consult (permissible substitution): Payer: Medicare Other | Admitting: Internal Medicine

## 2018-08-02 ENCOUNTER — Telehealth: Payer: Self-pay | Admitting: Neurology

## 2018-08-02 NOTE — Telephone Encounter (Signed)
I called patient and LVM regarding rescheduling 8/6 appointment due to Angela Dudley not seeing patients on Thursday afternoons. Requested patient call back to reschedule.

## 2018-08-11 ENCOUNTER — Ambulatory Visit: Payer: Medicare Other | Admitting: Neurology

## 2018-09-05 ENCOUNTER — Telehealth: Payer: Self-pay | Admitting: Neurology

## 2018-09-05 NOTE — Telephone Encounter (Signed)
Lvm for pt to call back and r/s appt- also stated appt for 9/3 will be c/a

## 2018-09-06 ENCOUNTER — Ambulatory Visit: Payer: Medicare Other | Admitting: Neurology

## 2018-09-07 ENCOUNTER — Ambulatory Visit: Payer: Medicare Other | Admitting: Neurology

## 2018-09-15 ENCOUNTER — Telehealth: Payer: Self-pay | Admitting: Neurology

## 2018-09-15 MED ORDER — GABAPENTIN 100 MG PO CAPS: 200.0000 mg | ORAL_CAPSULE | Freq: Two times a day (BID) | ORAL | 1 refills | Status: DC

## 2018-09-15 NOTE — Addendum Note (Signed)
Addended by: Kathrynn Ducking on: 09/15/2018 01:22 PM   Modules accepted: Orders

## 2018-09-15 NOTE — Telephone Encounter (Signed)
Pt states she has gone back to 2 a day on the gabapentin (NEURONTIN) 100 MG capsule because it helps her to sleep better.  Pt states she is feeling fine and really does not feel the need for the appointment.  Pt wants RN to call and let her know if she can get her refill and skip this 6 month f/u.  Pt is still using [place]

## 2018-09-15 NOTE — Telephone Encounter (Signed)
A prescription for gabapentin will be sent in.

## 2018-09-15 NOTE — Telephone Encounter (Signed)
I talked with the pt. She is requesting to move her appointment out to Feb of 2021 due to covid and since she is doing well. Pt states she has gone back up to 2 bid tabs of gabapentin 100 mg and would like Rx to be sent. Last rx approved by this office was for 1 tab daily. Will fwd refill to MD to refill if appropriate.

## 2018-09-15 NOTE — Addendum Note (Signed)
Addended by: Verlin Grills T on: 09/15/2018 01:02 PM   Modules accepted: Orders

## 2018-09-22 ENCOUNTER — Ambulatory Visit: Payer: Medicare Other | Admitting: Family Medicine

## 2018-09-27 ENCOUNTER — Other Ambulatory Visit: Payer: Self-pay | Admitting: Neurology

## 2018-09-28 ENCOUNTER — Telehealth: Payer: Self-pay | Admitting: Internal Medicine

## 2018-09-28 ENCOUNTER — Telehealth: Payer: Self-pay | Admitting: Emergency Medicine

## 2018-09-28 NOTE — Telephone Encounter (Signed)
error 

## 2018-11-14 NOTE — Telephone Encounter (Signed)
Noted  

## 2018-11-16 ENCOUNTER — Other Ambulatory Visit: Payer: Self-pay | Admitting: Orthopedic Surgery

## 2018-12-06 ENCOUNTER — Other Ambulatory Visit (HOSPITAL_COMMUNITY)
Admission: RE | Admit: 2018-12-06 | Discharge: 2018-12-06 | Disposition: A | Discharge status: Home / Self Care | Payer: Medicare Other | Source: Ambulatory Visit | Attending: Orthopedic Surgery | Admitting: Orthopedic Surgery

## 2018-12-06 DIAGNOSIS — Z01812 Encounter for preprocedural laboratory examination: Secondary | ICD-10-CM | POA: Insufficient documentation

## 2018-12-06 DIAGNOSIS — Z20828 Contact with and (suspected) exposure to other viral communicable diseases: Secondary | ICD-10-CM | POA: Insufficient documentation

## 2018-12-07 ENCOUNTER — Encounter (HOSPITAL_COMMUNITY): Payer: Self-pay | Admitting: *Deleted

## 2018-12-07 ENCOUNTER — Other Ambulatory Visit: Payer: Self-pay

## 2018-12-07 LAB — NOVEL CORONAVIRUS, NAA (HOSP ORDER, SEND-OUT TO REF LAB; TAT 18-24 HRS): SARS-CoV-2, NAA: NOT DETECTED

## 2018-12-07 NOTE — Progress Notes (Signed)
Pt denies SOB, chest pain, and being under the care of a cardiologist. Pt stated that PCP is Dr. Andria Rhein. Pt denies having a cardiac cath. Nurse requested recent labs from [employer] and [place] note, EKG, stress and echo from [facility]; awaiting response. Pt made aware to stop taking  Aspirin (unless otherwise advised by surgeon), vitamins, fish oil and all 10 herbal medications   Do not take any NSAIDs ie: Ibuprofen, Advil, Naproxen (Aleve), Motrin, BC and Goody Powder. Pt verbalized understanding of all pre-op instructions.

## 2018-12-09 ENCOUNTER — Telehealth (HOSPITAL_COMMUNITY): Payer: Self-pay | Admitting: Vascular Surgery

## 2018-12-09 ENCOUNTER — Ambulatory Visit: Admit: 2018-12-09 | Payer: Medicare Other | Admitting: Orthopedic Surgery

## 2018-12-09 HISTORY — DX: Unspecified asthma, uncomplicated: J45.909

## 2018-12-09 HISTORY — DX: Other synovitis and tenosynovitis, unspecified site: M65.80

## 2018-12-09 HISTORY — DX: Carpal tunnel syndrome, left upper limb: G56.02

## 2018-12-09 HISTORY — DX: Prediabetes: R73.03

## 2018-12-09 HISTORY — DX: Synovitis and tenosynovitis, unspecified: M65.9

## 2018-12-09 SURGERY — CARPAL TUNNEL RELEASE
Anesthesia: General | Laterality: Left

## 2018-12-09 NOTE — Progress Notes (Signed)
Anesthesia Note: Patient is a 73 year old female who was scheduled for carpal tunnel release on 12/09/18. For unclear reasons, case was cancelled on 12/08/18; however, cardiology records that had been requested from [facility] were received and are outlined below.  Nuclear stress test 07/07/18: Impression: 1.  There is no scintigraphic evidence of inducible myocardial ischemia.  Normal pharmacologic nuclear stress test. 2.  The post stress ejection fraction is 72%.  Global left ventricular systolic function is normal. 3.  Nondiagnostic electrocardiogram. 4.  This was essentially a low risk scan.  EKG 06/13/18:  Interpretation: Sinus rhythm Within normal limits  Echocardiogram 02/11/17 (scanned under Media tab, Correspondence, 11/30/17): Summary: The left ventricular ejection fraction is normal at 55 to 60%. There is mild [1+] aortic regurgitation present. The left ventricle is grossly normal size. There is no thrombus. There is normal left ventricular wall thickness.   Myra Gianotti, PA-C Surgical Short Stay/Anesthesiology [facility] Phone [phone] [facility] Phone [phone] 12/09/2018 4:32 PM

## 2019-01-30 ENCOUNTER — Other Ambulatory Visit: Payer: Self-pay | Admitting: Neurology

## 2019-02-09 ENCOUNTER — Encounter: Payer: Self-pay | Admitting: Neurology

## 2019-02-09 ENCOUNTER — Ambulatory Visit (INDEPENDENT_AMBULATORY_CARE_PROVIDER_SITE_OTHER): Payer: Medicare Other | Admitting: Neurology

## 2019-02-09 VITALS — BP 146/76 | HR 75 | Temp 98.4°F | Wt 225.0 lb

## 2019-02-09 DIAGNOSIS — M47812 Spondylosis without myelopathy or radiculopathy, cervical region: Secondary | ICD-10-CM

## 2019-02-09 DIAGNOSIS — G4486 Cervicogenic headache: Secondary | ICD-10-CM

## 2019-02-09 DIAGNOSIS — R519 Headache, unspecified: Secondary | ICD-10-CM | POA: Diagnosis not present

## 2019-02-09 NOTE — Progress Notes (Signed)
Reason for visit: Chronic neck pain, cervicogenic headache  Angela Dudley is an 74 y.o. female  History of present illness:  Angela Dudley is a 74 year old left-handed white female with a history of morbid obesity and a history of chronic neck pain.  The patient has a lot of degenerative arthritis in the knees, she is not sleeping well in part because of the neck pain and knee pain.  She claims that when she got a 3-day course of Decadron in May 2020 for her headache, she had significant issues with shortness of breath and was found to have pulmonary edema.  She claims it took 3 or 4 months to get over this.  The patient remains on gabapentin taking 200 mg at night.  She does not take gabapentin during the daytime.  We discussed the possibility of trying hemp oil for her sleeping problems and for her arthritis pain, but she never tried this.  She is on Requip for restless leg syndrome currently.  Past Medical History:  Diagnosis Date  . Abdominal pain, generalized   . Allergy   . Anemia   . Anxiety    pt denies  . Asthma   . Back pain   . Cervicogenic headache 06/08/2016  . Chronic fatigue syndrome   . Cramps, muscle, general   . Degenerative disc disease   . Diverticulosis of colon (without mention of hemorrhage)   . Dizziness   . Esophageal hernia   . Fever   . Fibromyalgia   . Frequent urination at night   . GERD (gastroesophageal reflux disease)    under control  . H/O hiatal hernia   . Headache(784.0)   . Hemorrhoids   . Hyperlipidemia   . Hypothyroidism    no medication- endrocnologist said she never had it  . IBS (irritable bowel syndrome)   . Idiopathic peripheral autonomic neuropathy   . Left carpal tunnel syndrome   . Numbness and tingling    hands, feet, & lips  . Obesity   . Obesity   . Orthostatic hypotension    with prev tilt table test- 08/18/16- "have not passed out in years."  . Orthostatic hypotension   . Osteoarthritis   . Other B-complex  deficiencies   . Peptic ulcer disease   . Peritoneal adhesions (postoperative) (postinfection)   . Pre-diabetes   . Sarcoidosis   . Sarcoidosis   . Shortness of breath    exertion- due to chronic fatigue syndrome, sarcoid  . Stenosing tenosynovitis    left wrist  . Thyroid disease    Unspecified hypothyroidism  . Unspecified intestinal obstruction   . UTI (lower urinary tract infection)    hx of  . Vitreous floaters, left   . Wart viral     Past Surgical History:  Procedure Laterality Date  . ANTERIOR CERVICAL DECOMP/DISCECTOMY FUSION N/A 10/16/2013   Procedure: Cervical three-four, Cervical five-six Anterior cervical decompression/diskectomy/fusion;  Surgeon: Kristeen Miss, MD;  Location: [facility];  Service: Neurosurgery;  Laterality: N/A;  Cervical three-four, Cervical five-six Anterior cervical decompression/diskectomy/fusion  . ANTERIOR CERVICAL DECOMP/DISCECTOMY FUSION N/A 07/23/2014   Procedure: Cervical four-five Anterior cervical decompression/diskectomy/fusion;  Surgeon: Kristeen Miss, MD;  Location: [facility];  Service: Neurosurgery;  Laterality: N/A;  C4-5 Anterior cervical decompression/diskectomy/fusion  . APPENDECTOMY    . BACK SURGERY  2015   herniated disc - cervical  . BELPHAROPTOSIS REPAIR Bilateral   . BIOPSY THYROID     benign  . CARPAL TUNNEL RELEASE Bilateral   .  CATARACT EXTRACTION Bilateral 1998  . CHOLECYSTECTOMY  2006  . COLON SURGERY  1996   partial colon removal  . COLONOSCOPY W/ POLYPECTOMY    . DILATION AND CURETTAGE OF UTERUS    . Doppler arterial studies  05/13/2008   Both carotid systems demonstrate minimal plaque formation with normal flow velocities throughout.  Vertebrals demonstrate antegrade flow bilaterally.  . DOPPLER ECHOCARDIOGRAPHY  2010   Diastolic relaxation abnormality, mild AVSC, mild MR/TR and mild to mod AR  . ESOPHAGOGASTRODUODENOSCOPY    . HERNIA REPAIR  2005   [facility]  . LASIK  1998   Cataract  . LUNG BIOPSY    .  LYMPH NODE DISSECTION     sternum  . MASS EXCISION Right 10/19/2016   Procedure: EXCISION DUBUYTRENS NODULE RIGHT MIDDLE FINGER;  Surgeon: Dairl Ponder, MD;  Location: [facility] OR;  Service: Orthopedics;  Laterality: Right;  . NASAL SEPTUM SURGERY  2006  . NOSE SURGERY  2006  . Nuclear Test  08/2005   Normal (chest pain)  . PARS PLANA VITRECTOMY Left 08/20/2016  . PARS PLANA VITRECTOMY Left 08/20/2016   Procedure: PARS PLANA VITRECTOMY WITH 25 GAUGE;  Surgeon: Edmon Crape, MD;  Location[facility] OR;  Service: Ophthalmology;  Laterality: Left;  . PARS PLANA VITRECTOMY Right 11/30/2017   Procedure: PARS PLANA VITRECTOMY WITH 25 GAUGE;  Surgeon: Edmon Crape, MD;  Location: [facility] OR;  Service: Ophthalmology;  Laterality: Right;  . PARTIAL COLECTOMY  1996   complicated by an abscess formation  . PHOTOCOAGULATION WITH LASER Right 11/30/2017   Procedure: PHOTOCOAGULATION WITH LASER;  Surgeon: Edmon Crape, MD;  Location: [facility];  Service: Ophthalmology;  Laterality: Right;  . Right hip bursa removal  1991  . SBO  2002  . TENOLYSIS Right 10/19/2016   Procedure: RIGHT WRIST STENOSING TENOSYNOVITIS RELEASE ;  Surgeon: Dairl Ponder, MD;  Location: [facility] OR;  Service: Orthopedics;  Laterality: Right;  . TONSILLECTOMY    . TRIGGER FINGER RELEASE Right 10/19/2016   Procedure: RIGHT THUMB RELEASE TRIGGER FINGER/A-1 PULLEY;  Surgeon: Dairl Ponder, MD;  Location: [facility] OR;  Service: Orthopedics;  Laterality: Right;    Family History  Problem Relation Age of Onset  . Crohn's disease Mother   . Heart disease Father   . Hyperlipidemia Neg Hx        No history of early CAD  . Cancer Neg Hx        No history of lymphoma or leukemia    Social history:  reports that she has never smoked. She has never used smokeless tobacco. She reports that she does not drink alcohol or use drugs.    Allergies  Allergen Reactions  . Amoxicillin Rash and Other (See Comments)    Did it involve swelling of the  face/tongue/throat, SOB, or low BP? No Did it involve sudden or severe rash/hives, skin peeling, or any reaction on the inside of your mouth or nose? No Did you need to seek medical attention at a hospital or doctor's office? No When did it last happen?35 years If all above answers are "NO", may proceed with cephalosporin use.    . Other Other (See Comments)    Hepatotoxic Gases per MD request this is not to be used due to Chronic Fatigue Syndrome creates risk for Hepatitis.   . Esomeprazole Magnesium Other (See Comments)     Esophageal spasm  . Percocet [Oxycodone-Acetaminophen] Other (See Comments)    It "wires me" , I can't sleep  Medications:  Prior to Admission medications   Medication Sig Start Date End Date Taking? Authorizing Provider  acetaminophen (TYLENOL) 500 MG tablet Take 1,000 mg by mouth every 6 (six) hours as needed for moderate pain or headache.   Yes [provider]  B Complex-C-Folic Acid (STRESS B COMPLEX PO) Take 1 tablet by mouth daily.   Yes [provider]  Biotin 5000 MCG TABS Take 5,000 mcg by mouth daily.   Yes [provider]  Black Elderberry (SAMBUCUS ELDERBERRY PO) Take 1,250 mg by mouth daily.   Yes [provider]  calcium carbonate (OS-CAL - DOSED IN MG OF ELEMENTAL CALCIUM) 1250 (500 Ca) MG tablet Take 5 tablets by mouth at bedtime.   Yes [provider]  cetirizine (ZYRTEC) 10 MG tablet Take 10 mg by mouth at bedtime.    Yes [provider]  Chromium Picolinate 500 MCG CAPS Take 500 mcg by mouth daily.   Yes [provider]  Coenzyme Q10 (COQ-10 PO) Take 120 mg by mouth daily.   Yes [provider]  gabapentin (NEURONTIN) 100 MG capsule Take 2 capsules (200 mg total) by mouth 2 (two) times daily. 01/31/19  Yes York Spaniel, MD  Ginger, Zingiber officinalis, (GINGER ROOT) 550 MG CAPS Take 1,100 mg by mouth 3 (three) times daily.    Yes [provider]    Glucosamine-Chondroit-Vit C-Mn (GLUCOSAMINE CHONDR 1500 COMPLX) CAPS Take 1 capsule by mouth daily.   Yes [provider]  LECITHIN PO Take 2-4 tablets by mouth See admin instructions. Take 4 tablets at night, may take a 2 to 4 tablets as needed to continue sleep if woken up   Yes [provider]  Magnesium 500 MG TABS Take 1,000 mg by mouth at bedtime.    Yes [provider]  Omega-3 Fatty Acids (OMEGA 3 PO) Take 1,360 mg by mouth daily.   Yes [provider]  Polyethyl Glycol-Propyl Glycol (SYSTANE) 0.4-0.3 % GEL ophthalmic gel Place 1 application into both eyes 2 (two) times daily as needed (dry eyes).   Yes [provider]  Potassium 99 MG TABS Take 495 mg by mouth at bedtime as needed (muscle cramps).    Yes [provider]  potassium chloride SA (K-DUR,KLOR-CON) 20 MEQ tablet Take 1 tablet (20 mEq total) by mouth daily. Patient taking differently: Take 20 mEq by mouth daily in the afternoon.    Yes Joaquim Nam, MD  Probiotic Product (VSL#3) CAPS Take 1 capsule by mouth daily.   Yes [provider]  rOPINIRole (REQUIP) 0.25 MG tablet Take 0.25 mg by mouth at bedtime.   Yes [provider]  Selenium 200 MCG CAPS Take 200 mcg by mouth daily.    Yes [provider]  spironolactone (ALDACTONE) 50 MG tablet Take 50 mg by mouth daily in the afternoon.   Yes [provider]  torsemide (DEMADEX) 20 MG tablet Take by mouth. 08/02/18  Yes [provider]  TURMERIC PO Take 800 mg by mouth 3 (three) times daily.   Yes [provider]  Zinc 50 MG TABS Take 50 mg by mouth daily.    Yes [provider]    ROS:  Out of a complete 14 system review of symptoms, the patient complains only of the following symptoms, and all other reviewed systems are negative.  Neck pain Walking difficulty Insomnia  Blood pressure (!) 146/76, pulse 75, temperature 98.4 F (36.9 C), weight 225 lb  (102.1 kg).  Physical Exam  General: The patient is alert and cooperative at the time of the examination.  The patient is markedly obese, central obesity.  Skin: No significant peripheral edema is noted.   Neurologic Exam  Mental status: The patient is alert and oriented x 3 at the time of the examination. The patient has apparent normal recent and remote memory, with an apparently normal attention span and concentration ability.   Cranial nerves: Facial symmetry is present. Speech is normal, no aphasia or dysarthria is noted. Extraocular movements are full. Visual fields are full.  Motor: The patient has good strength in all 4 extremities.  Sensory examination: Soft touch sensation is symmetric on the face, arms, and legs.  Coordination: The patient has good finger-nose-finger and heel-to-shin bilaterally.  Gait and station: The patient has a slightly wide-based gait.  The patient is able to walk without assistance.  Tandem gait was not attempted.  Romberg is negative.  Reflexes: Deep tendon reflexes are symmetric.   Assessment/Plan:  1.  Cervical spondylosis, chronic neck pain  2.  Cervicogenic headache  3.  Degenerative arthritis  I have recommended that the patient try hemp oil for her sleep issues and for her arthritis pain.  The patient will continue the gabapentin, she will contact our office if she has any concerns.  She will follow-up here in 6 months.  Marlan Palau MD 02/09/2019 12:26 PM  [facility] [address] [place], [place] [address]  Phone [phone] Fax [phone]

## 2019-05-22 ENCOUNTER — Telehealth: Payer: Self-pay | Admitting: Neurology

## 2019-05-22 NOTE — Telephone Encounter (Signed)
I called pt about her concerns today. Pt stated she had neck surgery in 2015 but for the last couple of weeks her balance has been getting progressive worse. She also has headache pain but cannot describe to me how it feels. She does use a walker at times. I tried to schedule her with Angela Dudley on May 25, 2019 at 0915am.Pt stated she does not get up that early and she lives in [place]. Pt wanted to be schedule with Angela Dudley and I stated his schedule is limited now because he is part time. I stated per his last note she can follow up with Angela Dudley. I stated message will be sent to work in [place]. She verbalized understanding.

## 2019-05-22 NOTE — Telephone Encounter (Signed)
Pt called to advise she has loss of balance and feels like she is going to fall. Pt states she doesn't necessarily feel dizzy but feels unstable .

## 2019-05-24 NOTE — Telephone Encounter (Signed)
Pls setup f/u appt with Dr. Anne Hahn or NP; also could consider PCP follow up. -VRP

## 2019-05-25 NOTE — Telephone Encounter (Signed)
I called pt back , Angela Sago NP did not have any appts for June 2021 or last week in May. I schedule pt for June 16, 2019 at 0945am. I advise pt we closed at 1200pm on fridays. Pt stated she is still off balance sometimes. She likes to have a mid day or afternoon appt. I add pt on waiting list for sooner appt.

## 2019-05-31 ENCOUNTER — Ambulatory Visit (INDEPENDENT_AMBULATORY_CARE_PROVIDER_SITE_OTHER): Payer: Medicare Other | Admitting: Neurology

## 2019-05-31 ENCOUNTER — Encounter: Payer: Self-pay | Admitting: Neurology

## 2019-05-31 ENCOUNTER — Other Ambulatory Visit: Payer: Self-pay

## 2019-05-31 ENCOUNTER — Telehealth: Payer: Self-pay | Admitting: Neurology

## 2019-05-31 VITALS — BP 125/71 | HR 85 | Ht 62.0 in | Wt 225.0 lb

## 2019-05-31 DIAGNOSIS — H814 Vertigo of central origin: Secondary | ICD-10-CM

## 2019-05-31 DIAGNOSIS — R519 Headache, unspecified: Secondary | ICD-10-CM | POA: Diagnosis not present

## 2019-05-31 DIAGNOSIS — G4486 Cervicogenic headache: Secondary | ICD-10-CM

## 2019-05-31 DIAGNOSIS — R2681 Unsteadiness on feet: Secondary | ICD-10-CM

## 2019-05-31 DIAGNOSIS — R269 Unspecified abnormalities of gait and mobility: Secondary | ICD-10-CM | POA: Diagnosis not present

## 2019-05-31 DIAGNOSIS — R55 Syncope and collapse: Secondary | ICD-10-CM

## 2019-05-31 NOTE — Progress Notes (Signed)
PATIENT: Angela Dudley DOB: 01-Sep-1945  REASON FOR VISIT: follow up HISTORY FROM: patient  HISTORY OF PRESENT ILLNESS: Today 05/31/19  Angela Dudley is a 74 year old female with history of morbid obesity and chronic neck pain.  She comes in today with a constellation of complaints:  1.  Tingling in the hands, has carpal tunnel on the left, indicates numbness tingling to the left hand, happening in the right some too  2.  Balance feels off, like she is going to pass out: Describes episodes that happen several times a month, she could be standing or sitting, will get a feeling that she is going to pass out, denies dizziness, diaphoresis, chest pain, palpitations, blurry vision.  She will sit down and rest, the episode will subside, but for the rest of the day she may feel poorly.  Feels her balance is not as steady, describes when she was leaning over in the garden, felt like she was going to fall forward.  She has not fallen since 2016.  She does have neuropathy.  She uses a cane.  She lives alone, says she has been hesitant to work in the garden due to fear of falling.  3.  Eyes not focusing, has been a chronic issue, denies double vision, has not seen ophthalmology recently, feels her depth perception is not good  4.  Headache, chronic cervicogenic headache, takes gabapentin 200 mg at bedtime, but this is mostly for sleep, says she cannot take steroids, when last taken developed pulmonary edema  5.  Shaky at times  In 2019, she had evaluation for spells of tilting to the right, carotid Doppler was normal, MRI of the brain and MRA did not show any acute or chronic stroke issues.  She lives alone, drives a car, worried about her ability to live alone long term.  Sees PCP regularly, but not for these issues.  She is very sensitive to medications.  HISTORY 02/09/2019 Dr. Anne Hahn: Angela Dudley is a 74 year old left-handed white female with a history of morbid obesity and a history of chronic neck  pain.  The patient has a lot of degenerative arthritis in the knees, she is not sleeping well in part because of the neck pain and knee pain.  She claims that when she got a 3-day course of Decadron in May 2020 for her headache, she had significant issues with shortness of breath and was found to have pulmonary edema.  She claims it took 3 or 4 months to get over this.  The patient remains on gabapentin taking 200 mg at night.  She does not take gabapentin during the daytime.  We discussed the possibility of trying hemp oil for her sleeping problems and for her arthritis pain, but she never tried this.  She is on Requip for restless leg syndrome currently.  REVIEW OF SYSTEMS: Out of a complete 14 system review of symptoms, the patient complains only of the following symptoms, and all other reviewed systems are negative.  Headache, balance instability, walking difficulty  ALLERGIES: Allergies  Allergen Reactions  . Amoxicillin Rash and Other (See Comments)    Did it involve swelling of the face/tongue/throat, SOB, or low BP? No Did it involve sudden or severe rash/hives, skin peeling, or any reaction on the inside of your mouth or nose? No Did you need to seek medical attention at a hospital or doctor's office? No When did it last happen?35 years If all above answers are "NO", may proceed with cephalosporin use.    Marland Kitchen  Other Other (See Comments)    Hepatotoxic Gases per MD request this is not to be used due to Chronic Fatigue Syndrome creates risk for Hepatitis.   . Decadron [Dexamethasone]     Breathing problems  . Esomeprazole Magnesium Other (See Comments)     Esophageal spasm  . Percocet [Oxycodone-Acetaminophen] Other (See Comments)    It "wires me" , I can't sleep    HOME MEDICATIONS: Outpatient Medications Prior to Visit  Medication Sig Dispense Refill  . acetaminophen (TYLENOL) 500 MG tablet Take 1,000 mg by mouth every 6 (six) hours as needed for moderate pain or headache.     . B Complex-C-Folic Acid (STRESS B COMPLEX PO) Take 1 tablet by mouth daily.    . Biotin 5000 MCG TABS Take 5,000 mcg by mouth daily.    Marland Kitchen Black Elderberry (SAMBUCUS ELDERBERRY PO) Take 1,250 mg by mouth daily.    . calcium carbonate (OS-CAL - DOSED IN MG OF ELEMENTAL CALCIUM) 1250 (500 Ca) MG tablet Take 5 tablets by mouth at bedtime.    . cetirizine (ZYRTEC) 10 MG tablet Take 10 mg by mouth at bedtime.     . Chromium Picolinate 500 MCG CAPS Take 500 mcg by mouth daily.    . Coenzyme Q10 (COQ-10 PO) Take 120 mg by mouth daily.    Marland Kitchen gabapentin (NEURONTIN) 100 MG capsule Take 2 capsules (200 mg total) by mouth 2 (two) times daily. 180 capsule 1  . Ginger, Zingiber officinalis, (GINGER ROOT) 550 MG CAPS Take 1,100 mg by mouth 3 (three) times daily.     Marland Kitchen LECITHIN PO Take 2-4 tablets by mouth See admin instructions. Take 4 tablets at night, may take a 2 to 4 tablets as needed to continue sleep if woken up    . Magnesium 500 MG TABS Take 1,000 mg by mouth at bedtime.     . Omega-3 Fatty Acids (OMEGA 3 PO) Take 1,360 mg by mouth daily.    Bertram Gala Glycol-Propyl Glycol (SYSTANE) 0.4-0.3 % GEL ophthalmic gel Place 1 application into both eyes 2 (two) times daily as needed (dry eyes).    . Potassium 99 MG TABS Take 495 mg by mouth at bedtime as needed (muscle cramps).     . potassium chloride SA (K-DUR,KLOR-CON) 20 MEQ tablet Take 1 tablet (20 mEq total) by mouth daily. (Patient taking differently: Take 20 mEq by mouth daily in the afternoon. ) 90 tablet 1  . Probiotic Product (VSL#3) CAPS Take 1 capsule by mouth daily.    Marland Kitchen rOPINIRole (REQUIP) 0.25 MG tablet Take 0.25 mg by mouth at bedtime.    . Selenium 200 MCG CAPS Take 200 mcg by mouth daily.     Marland Kitchen spironolactone (ALDACTONE) 50 MG tablet Take 50 mg by mouth daily in the afternoon.    . traZODone (DESYREL) 50 MG tablet 1 - 3 tablet qhs prn insomnia as directed    . TURMERIC PO Take 800 mg by mouth 3 (three) times daily.    . Zinc 50 MG TABS  Take 50 mg by mouth daily.     . Glucosamine-Chondroit-Vit C-Mn (GLUCOSAMINE CHONDR 1500 COMPLX) CAPS Take 1 capsule by mouth daily.     No facility-administered medications prior to visit.    PAST MEDICAL HISTORY: Past Medical History:  Diagnosis Date  . Abdominal pain, generalized   . Allergy   . Anemia   . Anxiety    pt denies  . Asthma   . Back pain   .  Cervicogenic headache 06/08/2016  . Chronic fatigue syndrome   . Cramps, muscle, general   . Degenerative disc disease   . Diverticulosis of colon (without mention of hemorrhage)   . Dizziness   . Esophageal hernia   . Fever   . Fibromyalgia   . Frequent urination at night   . GERD (gastroesophageal reflux disease)    under control  . H/O hiatal hernia   . Headache(784.0)   . Hemorrhoids   . Hyperlipidemia   . Hypothyroidism    no medication- endrocnologist said she never had it  . IBS (irritable bowel syndrome)   . Idiopathic peripheral autonomic neuropathy   . Left carpal tunnel syndrome   . Numbness and tingling    hands, feet, & lips  . Obesity   . Obesity   . Orthostatic hypotension    with prev tilt table test- 08/18/16- "have not passed out in years."  . Orthostatic hypotension   . Osteoarthritis   . Other B-complex deficiencies   . Peptic ulcer disease   . Peritoneal adhesions (postoperative) (postinfection)   . Pre-diabetes   . Sarcoidosis   . Sarcoidosis   . Shortness of breath    exertion- due to chronic fatigue syndrome, sarcoid  . Stenosing tenosynovitis    left wrist  . Thyroid disease    Unspecified hypothyroidism  . Unspecified intestinal obstruction   . UTI (lower urinary tract infection)    hx of  . Vitreous floaters, left   . Wart viral     PAST SURGICAL HISTORY: Past Surgical History:  Procedure Laterality Date  . ANTERIOR CERVICAL DECOMP/DISCECTOMY FUSION N/A 10/16/2013   Procedure: Cervical three-four, Cervical five-six Anterior cervical decompression/diskectomy/fusion;   Surgeon: Barnett AbuHenry Elsner, MD;  Location: [facility];  Service: Neurosurgery;  Laterality: N/A;  Cervical three-four, Cervical five-six Anterior cervical decompression/diskectomy/fusion  . ANTERIOR CERVICAL DECOMP/DISCECTOMY FUSION N/A 07/23/2014   Procedure: Cervical four-five Anterior cervical decompression/diskectomy/fusion;  Surgeon: Barnett AbuHenry Elsner, MD;  Location: [facility];  Service: Neurosurgery;  Laterality: N/A;  C4-5 Anterior cervical decompression/diskectomy/fusion  . APPENDECTOMY    . BACK SURGERY  2015   herniated disc - cervical  . BELPHAROPTOSIS REPAIR Bilateral   . BIOPSY THYROID     benign  . CARPAL TUNNEL RELEASE Bilateral   . CATARACT EXTRACTION Bilateral 1998  . CHOLECYSTECTOMY  2006  . COLON SURGERY  1996   partial colon removal  . COLONOSCOPY W/ POLYPECTOMY    . DILATION AND CURETTAGE OF UTERUS    . Doppler arterial studies  05/13/2008   Both carotid systems demonstrate minimal plaque formation with normal flow velocities throughout.  Vertebrals demonstrate antegrade flow bilaterally.  . DOPPLER ECHOCARDIOGRAPHY  2010   Diastolic relaxation abnormality, mild AVSC, mild MR/TR and mild to mod AR  . ESOPHAGOGASTRODUODENOSCOPY    . HERNIA REPAIR  2005   [facility]  . LASIK  1998   Cataract  . LUNG BIOPSY    . LYMPH NODE DISSECTION     sternum  . MASS EXCISION Right 10/19/2016   Procedure: EXCISION DUBUYTRENS NODULE RIGHT MIDDLE FINGER;  Surgeon: Dairl PonderWeingold, Matthew, MD;  Location: [facility] OR;  Service: Orthopedics;  Laterality: Right;  . NASAL SEPTUM SURGERY  2006  . NOSE SURGERY  2006  . Nuclear Test  08/2005   Normal (chest pain)  . PARS PLANA VITRECTOMY Left 08/20/2016  . PARS PLANA VITRECTOMY Left 08/20/2016   Procedure: PARS PLANA VITRECTOMY WITH 25 GAUGE;  Surgeon: Edmon Crapeankin, Gary A, MD;  Location:  [facility] OR;  Service: Ophthalmology;  Laterality: Left;  . PARS PLANA VITRECTOMY Right 11/30/2017   Procedure: PARS PLANA VITRECTOMY WITH 25 GAUGE;  Surgeon: Edmon Crape, MD;   Location: [facility] OR;  Service: Ophthalmology;  Laterality: Right;  . PARTIAL COLECTOMY  1996   complicated by an abscess formation  . PHOTOCOAGULATION WITH LASER Right 11/30/2017   Procedure: PHOTOCOAGULATION WITH LASER;  Surgeon: Edmon Crape, MD;  [facility];  Service: Ophthalmology;  Laterality: Right;  . Right hip bursa removal  1991  . SBO  2002  . TENOLYSIS Right 10/19/2016   Procedure: RIGHT WRIST STENOSING TENOSYNOVITIS RELEASE ;  Surgeon: Dairl Ponder, MD;  Location: [facility] OR;  Service: Orthopedics;  Laterality: Right;  . TONSILLECTOMY    . TRIGGER FINGER RELEASE Right 10/19/2016   Procedure: RIGHT THUMB RELEASE TRIGGER FINGER/A-1 PULLEY;  Surgeon: Dairl Ponder, MD;  Location: [facility] OR;  Service: Orthopedics;  Laterality: Right;    FAMILY HISTORY: Family History  Problem Relation Age of Onset  . Crohn's disease Mother   . Heart disease Father   . Hyperlipidemia Neg Hx        No history of early CAD  . Cancer Neg Hx        No history of lymphoma or leukemia    SOCIAL HISTORY: Social History   Socioeconomic History  . Marital status: Widowed    Spouse name: Not on file  . Number of children: 0  . Years of education: BS  . Highest education level: Not on file  Occupational History  . Occupation: Former, Charity fundraiser, Retired  Tobacco Use  . Smoking status: Never Smoker  . Smokeless tobacco: Never Used  Substance and Sexual Activity  . Alcohol use: No  . Drug use: No  . Sexual activity: Not on file  Other Topics Concern  . Not on file  Social History Narrative   Lives alone   Regular exericse:  Yes.  Gardens for a hobby.   Has not been sexually active since lost husband.    No hx of TB exposure.   She has 2 dogs at home and 5 cats.   Caffeine use: One soft drink daily   Social Determinants of Health   Financial Resource Strain:   . Difficulty of Paying Living Expenses:   Food Insecurity:   . Worried About Programme researcher, broadcasting/film/video in the Last Year:   . Garment/textile technologist in the Last Year:   Transportation Needs:   . Freight forwarder (Medical):   Marland Kitchen Lack of Transportation (Non-Medical):   Physical Activity:   . Days of Exercise per Week:   . Minutes of Exercise per Session:   Stress:   . Feeling of Stress :   Social Connections:   . Frequency of Communication with Friends and Family:   . Frequency of Social Gatherings with Friends and Family:   . Attends Religious Services:   . Active Member of Clubs or Organizations:   . Attends Banker Meetings:   Marland Kitchen Marital Status:   Intimate Partner Violence:   . Fear of Current or Ex-Partner:   . Emotionally Abused:   Marland Kitchen Physically Abused:   . Sexually Abused:    PHYSICAL EXAM  Vitals:   05/31/19 1050  BP: 125/71  Pulse: 85  Weight: 225 lb (102.1 kg)  Height: 5\' 2"  (1.575 m)   Body mass index is 41.15 kg/m.  Generalized: Well developed, in no acute distress, obese  Neurological examination  Mentation: Alert oriented to time, place, history taking. Follows all commands speech and language fluent Cranial nerve II-XII: Pupils were equal round reactive to light. Extraocular movements were full, visual field were full on confrontational test. Facial sensation and strength were normal.  Head turning and shoulder shrug  were normal and symmetric. Motor: The motor testing reveals 5 over 5 strength of all 4 extremities. Good symmetric motor tone is noted throughout.  Sensory: Sensory testing is intact to soft touch on all 4 extremities. No evidence of extinction is noted.  Coordination: Cerebellar testing reveals good finger-nose-finger and heel-to-shin bilaterally.  Gait and station: Has to push off from seated position to stand, gait is wide-based, cautious, slightly unsteady Reflexes: Deep tendon reflexes are symmetric   DIAGNOSTIC DATA (LABS, IMAGING, TESTING) - I reviewed patient records, labs, notes, testing and imaging myself where available.  Lab Results  Component Value  Date   WBC 7.5 11/30/2017   HGB 15.0 11/30/2017   HCT 47.3 (H) 11/30/2017   MCV 94.2 11/30/2017   PLT 226 11/30/2017      Component Value Date/Time   NA 139 11/30/2017 1049   K 4.1 11/30/2017 1049   CL 106 11/30/2017 1049   CO2 25 11/30/2017 1049   GLUCOSE 126 (H) 11/30/2017 1049   BUN 19 11/30/2017 1049   CREATININE 0.71 11/30/2017 1049   CALCIUM 9.5 11/30/2017 1049   PROT 7.0 10/29/2011 1256   ALBUMIN 3.7 10/29/2011 1256   AST 26 10/29/2011 1256   ALT 25 10/29/2011 1256   ALKPHOS 64 10/29/2011 1256   BILITOT 0.7 10/29/2011 1256   GFRNONAA >60 11/30/2017 1049   GFRAA >60 11/30/2017 1049   Lab Results  Component Value Date   CHOL 200 10/29/2011   HDL 30.40 (L) 10/29/2011   LDLDIRECT 153.5 10/29/2011   TRIG 202.0 (H) 10/29/2011   CHOLHDL 7 10/29/2011   No results found for: HGBA1C Lab Results  Component Value Date   VITAMINB12 1,029 (H) 10/29/2011   Lab Results  Component Value Date   TSH 1.15 10/29/2011      ASSESSMENT AND PLAN 74 y.o. year old female  has a past medical history of Abdominal pain, generalized, Allergy, Anemia, Anxiety, Asthma, Back pain, Cervicogenic headache (06/08/2016), Chronic fatigue syndrome, Cramps, muscle, general, Degenerative disc disease, Diverticulosis of colon (without mention of hemorrhage), Dizziness, Esophageal hernia, Fever, Fibromyalgia, Frequent urination at night, GERD (gastroesophageal reflux disease), H/O hiatal hernia, Headache(784.0), Hemorrhoids, Hyperlipidemia, Hypothyroidism, IBS (irritable bowel syndrome), Idiopathic peripheral autonomic neuropathy, Left carpal tunnel syndrome, Numbness and tingling, Obesity, Obesity, Orthostatic hypotension, Orthostatic hypotension, Osteoarthritis, Other B-complex deficiencies, Peptic ulcer disease, Peritoneal adhesions (postoperative) (postinfection), Pre-diabetes, Sarcoidosis, Sarcoidosis, Shortness of breath, Stenosing tenosynovitis, Thyroid disease, Unspecified intestinal obstruction, UTI  (lower urinary tract infection), Vitreous floaters, left, and Wart viral. here with:  1.  Cervical spondylosis, chronic neck pain 2.  Cervicogenic headache 3.  Gait instability 4.  Chronic fatigue syndrome 5.  Episodes of near syncope   Her issues overall appear chronic in nature, but she indicates the frequency has increased. I will order MRI of the brain and cardiac monitor, given report of increasing episodes of feeling like she will pass out.  MRI of the brain, for TIA evaluation.  I will also order physical therapy, due to balance concerns. I recommend she see her eye doctor for concern of trouble focusing her vision. She will follow-up in 3 months or sooner if needed.   I spent 30 minutes of face-to-face and  non-face-to-face time with patient.  This included previsit chart review, lab review, study review, order entry, electronic health record documentation, patient education.  Margie Ege, AGNP-C, DNP 05/31/2019, 11:25 AM [facility] [address], [address] [place], [place] [address] [phone]

## 2019-05-31 NOTE — Progress Notes (Signed)
I have read the note, and I agree with the clinical assessment and plan.  Tyhir Schwan K Chenille Toor   

## 2019-05-31 NOTE — Telephone Encounter (Signed)
Please cancel her appointment in June with Dr. Anne Hahn. Give her follow-up with Dr. Anne Hahn in 3-4 months. I have ordered MRI of the brain, cardiac monitor, and physical therapy.

## 2019-05-31 NOTE — Patient Instructions (Signed)
It was great to meet you today  I'll call you today with our treatment plan

## 2019-05-31 NOTE — Telephone Encounter (Addendum)
I called pt that June 11 appt with Dr. Anne Hahn will be cancel because she was seen today by Maralyn Sago NP. Pt schedule for 08/08/2019 at 1130am.PT verbalized understanding.

## 2019-06-08 ENCOUNTER — Telehealth: Payer: Self-pay | Admitting: Neurology

## 2019-06-08 NOTE — Telephone Encounter (Signed)
medicare/[employer] GEHA Berkley Harvey: NPR Ref # Angela Dudley S order sent to GI. They will reach out to the patient to schedule.

## 2019-06-09 ENCOUNTER — Telehealth: Payer: Self-pay | Admitting: Radiology

## 2019-06-09 NOTE — Telephone Encounter (Signed)
Enrolled patient for a 30 Day Preventice Event monitor to be mailed to patients home. Patient was currently working in the yard and cound not listen to instrucitons will call back in a few days.

## 2019-06-13 ENCOUNTER — Other Ambulatory Visit: Payer: Self-pay | Admitting: Neurology

## 2019-06-13 NOTE — Telephone Encounter (Signed)
Spoke to patient and went over brief monitor instructions. She knows to expect the monitor to arrive in 5-7 days

## 2019-06-13 NOTE — Telephone Encounter (Signed)
I personally called the patient.  She is adamant she cannot wear a mask for MRI, says she cannot breathe wearing a mask.  She usually wears a face shield.  We talked through a variety of different mask options-ultimately she refuses to wear a mask. I even offered Xanax to help relax, indicates she has no driver to get her home, no one to ask.

## 2019-06-13 NOTE — Telephone Encounter (Signed)
Bernice from GI called to report that pt has refused the MRI because she will not wear a mask through the procedure and therefore pt decided to cancel the MRI.  Merdis Delay can be reached at [phone] for questions.

## 2019-06-16 ENCOUNTER — Ambulatory Visit: Payer: No Typology Code available for payment source | Admitting: Neurology

## 2019-06-23 ENCOUNTER — Ambulatory Visit (INDEPENDENT_AMBULATORY_CARE_PROVIDER_SITE_OTHER): Payer: Medicare Other

## 2019-06-23 DIAGNOSIS — H814 Vertigo of central origin: Secondary | ICD-10-CM

## 2019-06-23 DIAGNOSIS — R55 Syncope and collapse: Secondary | ICD-10-CM | POA: Diagnosis not present

## 2019-07-06 DIAGNOSIS — S8290XA Unspecified fracture of unspecified lower leg, initial encounter for closed fracture: Secondary | ICD-10-CM

## 2019-07-06 HISTORY — DX: Unspecified fracture of unspecified lower leg, initial encounter for closed fracture: S82.90XA

## 2019-07-11 ENCOUNTER — Other Ambulatory Visit: Payer: Self-pay

## 2019-07-11 ENCOUNTER — Encounter (HOSPITAL_COMMUNITY): Payer: Self-pay | Admitting: Emergency Medicine

## 2019-07-11 ENCOUNTER — Emergency Department (HOSPITAL_COMMUNITY): Payer: Medicare Other

## 2019-07-11 ENCOUNTER — Emergency Department (HOSPITAL_COMMUNITY)
Admission: EM | Admit: 2019-07-11 | Discharge: 2019-07-14 | Disposition: A | Discharge status: Home / Self Care | Payer: Medicare Other | Attending: Emergency Medicine | Admitting: Emergency Medicine

## 2019-07-11 DIAGNOSIS — X58XXXD Exposure to other specified factors, subsequent encounter: Secondary | ICD-10-CM | POA: Insufficient documentation

## 2019-07-11 DIAGNOSIS — S8992XA Unspecified injury of left lower leg, initial encounter: Secondary | ICD-10-CM

## 2019-07-11 DIAGNOSIS — Z79899 Other long term (current) drug therapy: Secondary | ICD-10-CM | POA: Diagnosis not present

## 2019-07-11 DIAGNOSIS — Z20822 Contact with and (suspected) exposure to covid-19: Secondary | ICD-10-CM | POA: Diagnosis not present

## 2019-07-11 DIAGNOSIS — M6281 Muscle weakness (generalized): Secondary | ICD-10-CM | POA: Diagnosis not present

## 2019-07-11 DIAGNOSIS — S80912D Unspecified superficial injury of left knee, subsequent encounter: Secondary | ICD-10-CM | POA: Insufficient documentation

## 2019-07-11 DIAGNOSIS — E039 Hypothyroidism, unspecified: Secondary | ICD-10-CM | POA: Insufficient documentation

## 2019-07-11 DIAGNOSIS — M797 Fibromyalgia: Secondary | ICD-10-CM | POA: Insufficient documentation

## 2019-07-11 DIAGNOSIS — R2681 Unsteadiness on feet: Secondary | ICD-10-CM | POA: Diagnosis not present

## 2019-07-11 MED ORDER — MORPHINE SULFATE (PF) 4 MG/ML IV SOLN
4.0000 mg | Freq: Once | INTRAVENOUS | Status: AC
  Administered 2019-07-11: 4 mg via INTRAMUSCULAR
  Filled 2019-07-11: qty 1

## 2019-07-11 NOTE — ED Provider Notes (Signed)
[facility] EMERGENCY DEPARTMENT Provider Note   CSN: [id number] Arrival date & time: 07/11/19  1725     History Chief Complaint  Patient presents with  . Knee Pain    Angela Dudley is a 74 y.o. female with a history of fibromyalgia, PUD, chronic fatigue syndrome, sarcoidosis, and chronic bilateral knee osteoarthritis who presents the emergency department with a chief complaint of left knee pain.  The patient reports that she has been having worsening left knee pain since a prior injury in January 2021 and has been followed by Dr. Sherlean Foot and has been been receiving joint injections.  She reports that earlier today she was walking out of her house onto her porch when her left knee "buckled".  She reports sudden onset, sharp, severe pain to the left lateral knee that radiates to the proximal portion of the left lower leg.  Pain is worse with movement and weightbearing.  No known alleviating factors.  She did not fall.  Reports that she was able to very slowly limp back into her house, but reports that it took more than 10 minutes to go a few feet.  She applied a compression knee sleeve with no improvement in her symptoms and tried using a cane to take weight off of her left foot. No other treatment prior to arrival.  She reports that earlier today she was unable to stand up after using the toilet and also then had difficulty getting off the couch, which prompted her to call EMS. States she has 10 steps to get into her home.  She ambulates independently at baseline, but will sometimes use a cane. She does not have a walker. The patient has a history of chronic bilateral knee pain, left greater than right it is followed by Dr. Sherlean Foot with orthopaedic surgery.  She has a known meniscal tear on the left, but surgery has been deferred because the patient has stated that she needed to stay overnight in the hospital and does not have anyone to take care of her.   She is unable to take  NSAIDs due to history of peptic ulcer disease and cannot tolerate Prednisone and Decadron. Tramadol does not help her with pain. States that she cannot tolerate almost all narcotics "because they cause her to be wired", but she was able to take IV morphine once. She is unsure if she can tolerate muscle relaxers.   The history is provided by the patient. No language interpreter was used.       Past Medical History:  Diagnosis Date  . Abdominal pain, generalized   . Allergy   . Anemia   . Anxiety    pt denies  . Asthma   . Back pain   . Cervicogenic headache 06/08/2016  . Chronic fatigue syndrome   . Cramps, muscle, general   . Degenerative disc disease   . Diverticulosis of colon (without mention of hemorrhage)   . Dizziness   . Esophageal hernia   . Fever   . Fibromyalgia   . Frequent urination at night   . GERD (gastroesophageal reflux disease)    under control  . H/O hiatal hernia   . Headache(784.0)   . Hemorrhoids   . Hyperlipidemia   . Hypothyroidism    no medication- endrocnologist said she never had it  . IBS (irritable bowel syndrome)   . Idiopathic peripheral autonomic neuropathy   . Left carpal tunnel syndrome   . Numbness and tingling    hands,  feet, & lips  . Obesity   . Obesity   . Orthostatic hypotension    with prev tilt table test- 08/18/16- "have not passed out in years."  . Orthostatic hypotension   . Osteoarthritis   . Other B-complex deficiencies   . Peptic ulcer disease   . Peritoneal adhesions (postoperative) (postinfection)   . Pre-diabetes   . Sarcoidosis   . Sarcoidosis   . Shortness of breath    exertion- due to chronic fatigue syndrome, sarcoid  . Stenosing tenosynovitis    left wrist  . Thyroid disease    Unspecified hypothyroidism  . Unspecified intestinal obstruction   . UTI (lower urinary tract infection)    hx of  . Vitreous floaters, left   . Wart viral     Patient Active Problem List   Diagnosis Date Noted  . Near  syncope 05/31/2019  . Gait instability 05/31/2019  . Suspected COVID-19 virus infection 07/22/2018  . Dyspnea on exertion 07/04/2018  . Vitreous membranes and strands of right eye 11/30/2017  . Trigger finger of right thumb 10/19/2016  . Sleep apnea in adult 08/20/2016  . Cervicogenic headache 06/08/2016  . Cervical spondylosis 07/23/2014  . Cervical spondylosis with radiculopathy 10/16/2013  . Back pain 08/30/2012  . VITAMIN B12 DEFICIENCY 09/04/2009  . HYPERLIPIDEMIA 09/04/2009  . OBESITY 09/04/2009  . Allergic rhinitis 09/04/2009  . GERD 09/04/2009  . OSTEOARTHRITIS 09/04/2009  . DEGENERATIVE DISC DISEASE 09/04/2009  . WART, VIRAL 08/15/2009  . UNSPECIFIED HYPOTHYROIDISM 08/15/2009  . BACK PAIN 08/15/2009  . MUSCLE CRAMPS 08/15/2009  . ABDOMINAL PAIN, GENERALIZED 12/13/2006  . SARCOIDOSIS 10/13/2006  . 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 ULCER, PEPTIC NOS W/O HEM/PERF W/O OBST 10/13/2006  . SMALL BOWEL OBSTRUCTION 10/13/2006  . DIVERTICULOSIS, COLON W/O HEM 10/13/2006  . ADHESIONS, PERITONEAL 10/13/2006  . CHRONIC FATIGUE SYNDROME 10/13/2006  . SYMPTOM, HEADACHE 10/13/2006    Past Surgical History:  Procedure Laterality Date  . ANTERIOR CERVICAL DECOMP/DISCECTOMY FUSION N/A 10/16/2013   Procedure: Cervical three-four, Cervical five-six Anterior cervical decompression/diskectomy/fusion;  Surgeon: Barnett Abu, MD;  Location: [facility];  Service: Neurosurgery;  Laterality: N/A;  Cervical three-four, Cervical five-six Anterior cervical decompression/diskectomy/fusion  . ANTERIOR CERVICAL DECOMP/DISCECTOMY FUSION N/A 07/23/2014   Procedure: Cervical four-five Anterior cervical decompression/diskectomy/fusion;  Surgeon: Barnett Abu, MD;  Location: [facility];  Service: Neurosurgery;  Laterality: N/A;  C4-5 Anterior cervical decompression/diskectomy/fusion  . APPENDECTOMY    . BACK SURGERY  2015   herniated disc - cervical  . BELPHAROPTOSIS REPAIR  Bilateral   . BIOPSY THYROID     benign  . CARPAL TUNNEL RELEASE Bilateral   . CATARACT EXTRACTION Bilateral 1998  . CHOLECYSTECTOMY  2006  . COLON SURGERY  1996   partial colon removal  . COLONOSCOPY W/ POLYPECTOMY    . DILATION AND CURETTAGE OF UTERUS    . Doppler arterial studies  05/13/2008   Both carotid systems demonstrate minimal plaque formation with normal flow velocities throughout.  Vertebrals demonstrate antegrade flow bilaterally.  . DOPPLER ECHOCARDIOGRAPHY  2010   Diastolic relaxation abnormality, mild AVSC, mild MR/TR and mild to mod AR  . ESOPHAGOGASTRODUODENOSCOPY    . HERNIA REPAIR  2005   [facility]  . LASIK  1998   Cataract  . LUNG BIOPSY    . LYMPH NODE DISSECTION     sternum  . MASS EXCISION Right 10/19/2016   Procedure: EXCISION DUBUYTRENS NODULE RIGHT MIDDLE FINGER;  Surgeon: Dairl Ponder, MD;  Location: [facility] OR;  Service: Orthopedics;  Laterality: Right;  . NASAL SEPTUM SURGERY  2006  . NOSE SURGERY  2006  . Nuclear Test  08/2005   Normal (chest pain)  . PARS PLANA VITRECTOMY Left 08/20/2016  . PARS PLANA VITRECTOMY Left 08/20/2016   Procedure: PARS PLANA VITRECTOMY WITH 25 GAUGE;  Surgeon: Edmon Crapeankin, Gary A, MD;  [facility];  Service: Ophthalmology;  Laterality: Left;  . PARS PLANA VITRECTOMY Right 11/30/2017   Procedure: PARS PLANA VITRECTOMY WITH 25 GAUGE;  Surgeon: Edmon Crapeankin, Gary A, MD;  [facility];  Service: Ophthalmology;  Laterality: Right;  . PARTIAL COLECTOMY  1996   complicated by an abscess formation  . PHOTOCOAGULATION WITH LASER Right 11/30/2017   Procedure: PHOTOCOAGULATION WITH LASER;  Surgeon: Edmon Crapeankin, Gary A, MD;  [facility];  Service: Ophthalmology;  Laterality: Right;  . Right hip bursa removal  1991  . SBO  2002  . TENOLYSIS Right 10/19/2016   Procedure: RIGHT WRIST STENOSING TENOSYNOVITIS RELEASE ;  Surgeon: Dairl PonderWeingold, Matthew, MD;  [facility];  Service: Orthopedics;  Laterality: Right;  . TONSILLECTOMY    . TRIGGER  FINGER RELEASE Right 10/19/2016   Procedure: RIGHT THUMB RELEASE TRIGGER FINGER/A-1 PULLEY;  Surgeon: Dairl PonderWeingold, Matthew, MD;  Location: [facility] OR;  Service: Orthopedics;  Laterality: Right;     OB History   No obstetric history on file.     Family History  Problem Relation Age of Onset  . Crohn's disease Mother   . Heart disease Father   . Hyperlipidemia Neg Hx        No history of early CAD  . Cancer Neg Hx        No history of lymphoma or leukemia    Social History   Tobacco Use  . Smoking status: Never Smoker  . Smokeless tobacco: Never Used  Vaping Use  . Vaping Use: Never used  Substance Use Topics  . Alcohol use: No  . Drug use: No    Home Medications Prior to Admission medications   Medication Sig Start Date End Date Taking? Authorizing Provider  acetaminophen (TYLENOL) 500 MG tablet Take 1,000 mg by mouth every 6 (six) hours as needed for moderate pain or headache.    [provider]  B Complex-C-Folic Acid (STRESS B COMPLEX PO) Take 1 tablet by mouth daily.    [provider]  Biotin 5000 MCG TABS Take 5,000 mcg by mouth daily.    [provider]  Black Elderberry (SAMBUCUS ELDERBERRY PO) Take 1,250 mg by mouth daily.    [provider]  calcium carbonate (OS-CAL - DOSED IN MG OF ELEMENTAL CALCIUM) 1250 (500 Ca) MG tablet Take 5 tablets by mouth at bedtime.    [provider]  cetirizine (ZYRTEC) 10 MG tablet Take 10 mg by mouth at bedtime.     [provider]  Chromium Picolinate 500 MCG CAPS Take 500 mcg by mouth daily.    [provider]  Coenzyme Q10 (COQ-10 PO) Take 120 mg by mouth daily.    [provider]  gabapentin (NEURONTIN) 100 MG capsule Take 2 capsules (200 mg total) by mouth 2 (two) times daily. 06/13/19   York SpanielWillis, Charles K, MD  Ginger, Zingiber officinalis, (GINGER ROOT) 550 MG CAPS Take 1,100 mg by mouth 3 (three) times daily.     [provider]  LECITHIN PO Take 2-4  tablets by mouth See admin instructions. Take 4 tablets at night, may take a 2 to 4 tablets as needed  to continue sleep if woken up    [provider]  Magnesium 500 MG TABS Take 1,000 mg by mouth at bedtime.     [provider]  Omega-3 Fatty Acids (OMEGA 3 PO) Take 1,360 mg by mouth daily.    [provider]  Polyethyl Glycol-Propyl Glycol (SYSTANE) 0.4-0.3 % GEL ophthalmic gel Place 1 application into both eyes 2 (two) times daily as needed (dry eyes).    [provider]  Potassium 99 MG TABS Take 495 mg by mouth at bedtime as needed (muscle cramps).     [provider]  potassium chloride SA (K-DUR,KLOR-CON) 20 MEQ tablet Take 1 tablet (20 mEq total) by mouth daily. Patient taking differently: Take 20 mEq by mouth daily in the afternoon.     Joaquim Nam, MD  Probiotic Product (VSL#3) CAPS Take 1 capsule by mouth daily.    [provider]  rOPINIRole (REQUIP) 0.25 MG tablet Take 0.25 mg by mouth at bedtime.    [provider]  Selenium 200 MCG CAPS Take 200 mcg by mouth daily.     [provider]  spironolactone (ALDACTONE) 50 MG tablet Take 50 mg by mouth daily in the afternoon.    [provider]  traZODone (DESYREL) 50 MG tablet 1 - 3 tablet qhs prn insomnia as directed 04/24/19   [provider]  TURMERIC PO Take 800 mg by mouth 3 (three) times daily.    [provider]  Zinc 50 MG TABS Take 50 mg by mouth daily.     [provider]    Allergies    Amoxicillin, Other, Decadron [dexamethasone], Esomeprazole magnesium, and Percocet [oxycodone-acetaminophen]  Review of Systems   Review of Systems  Constitutional: Negative for activity change, chills and fever.  Respiratory: Negative for shortness of breath.   Cardiovascular: Negative for chest pain.  Gastrointestinal: Negative for abdominal pain, diarrhea, nausea and vomiting.  Genitourinary: Negative for dysuria.   Musculoskeletal: Positive for arthralgias, gait problem and myalgias. Negative for back pain, neck pain and neck stiffness.  Skin: Negative for rash.  Allergic/Immunologic: Negative for immunocompromised state.  Neurological: Negative for seizures, syncope, weakness, numbness and headaches.  Psychiatric/Behavioral: Negative for confusion.    Physical Exam Updated Vital Signs BP 135/67 (BP Location: Left Arm)   Pulse 77   Temp 97.8 F (36.6 C) (Oral)   Resp 18   SpO2 96%   Physical Exam Vitals and nursing note reviewed.  Constitutional:      General: She is not in acute distress.    Appearance: She is obese. She is not ill-appearing, toxic-appearing or diaphoretic.  HENT:     Head: Normocephalic.  Eyes:     Conjunctiva/sclera: Conjunctivae normal.  Cardiovascular:     Rate and Rhythm: Normal rate and regular rhythm.     Heart sounds: No murmur heard.  No friction rub. No gallop.   Pulmonary:     Effort: Pulmonary effort is normal. No respiratory distress.  Abdominal:     General: There is no distension.     Palpations: Abdomen is soft.  Musculoskeletal:        General: Tenderness present. No swelling, deformity or signs of injury.     Cervical back: Neck supple.     Right lower leg: No edema.     Left lower leg: No edema.     Comments: Tender to palpation diffusely over the lateral aspect of the left knee.  No focal tenderness to palpation over  the medial joint line.  Negative anterior and posterior drawer test.  Knee appears stable with valgus and varus stress test.  Full active and passive range of motion.  Normal exam of the left ankle and hip with full range of motion and without tenderness.  DP and PT pulses are 2+ and symmetric.  Sensation is intact and equal throughout.  Spine is nontender.  Skin:    General: Skin is warm.     Findings: No rash.  Neurological:     Mental Status: She is alert.  Psychiatric:        Behavior: Behavior normal.     ED Results /  Procedures / Treatments   Labs (all labs ordered are listed, but only abnormal results are displayed) Labs Reviewed  CBC - Abnormal; Notable for the following components:      Result Value   RBC 5.21 (*)    Hemoglobin 15.6 (*)    HCT 47.5 (*)    All other components within normal limits  BASIC METABOLIC PANEL - Abnormal; Notable for the following components:   Sodium 133 (*)    Glucose, Bld 118 (*)    All other components within normal limits  URINALYSIS, ROUTINE W REFLEX MICROSCOPIC - Abnormal; Notable for the following components:   APPearance HAZY (*)    Ketones, ur 5 (*)    Leukocytes,Ua TRACE (*)    Bacteria, UA RARE (*)    All other components within normal limits  SARS CORONAVIRUS 2 BY RT PCR (HOSPITAL ORDER, PERFORMED IN [facility])    EKG None  Radiology DG Knee Complete 4 Views Left  Result Date: 07/11/2019 CLINICAL DATA:  Knee pain EXAM: LEFT KNEE - COMPLETE 4+ VIEW COMPARISON:  02/09/2019 FINDINGS: No fracture or malalignment. Mild patellofemoral and medial joint space degenerative change. Small knee effusion. Vascular calcifications. IMPRESSION: Mild degenerative change with small knee effusion. Electronically Signed   By: Jasmine Pang M.D.   On: 07/11/2019 18:20    Procedures Procedures (including critical care time)  Medications Ordered in ED Medications  morphine 4 MG/ML injection 4 mg (4 mg Intramuscular Given 07/11/19 2325)    ED Course  I have reviewed the triage vital signs and the nursing notes.  Pertinent labs & imaging results that were available during my care of the patient were reviewed by me and considered in my medical decision making (see chart for details).    MDM Rules/Calculators/A&P                          74 year old female with a history of fibromyalgia, PUD, chronic fatigue syndrome, sarcoidosis, and chronic bilateral knee osteoarthritis presenting after her left knee buckled earlier today while walking.  She did not  fall.  She was able to very slowly ambulate back in the house.  However, later she had significant difficulty getting up from the toilet and later called EMS after she was unable to get up after sitting on her couch.  She has a known meniscus tear to the left knee, but has been unable to have surgery as she lives alone and does not have any assistance at home.  The patient was discussed with Dr. Clarice Pole, attending physician.  X-ray of the left knee with degenerative osteoarthritic changes with a small knee effusion.  Low suspicion for occult fracture referred pain from the hip, lumbar radiculopathy, gout, or septic joint..  Patient was given morphine for pain control and she  was able to ambulate with a knee sleeve in place, but required the assistance of a walker as well as assistance getting into and out of a wheelchair.  Patient does not have a walker at home and since she lives alone, she will need to be assessed by PT OT.  These consults have been placed.  Transition of care consult has also been placed as patient does not have safe disposition for home as she has 10 steps into her house.  Basic labs were obtained as patient may need assistance with placement.  COVID-19 test is negative.  UA with trace leukocyte esterase, but patient is having no urinary complaints.  She has a very, very mild hyponatremia, but no other electrolyte derangements.  Patient care transferred to [facility] at the end of my shift. Patient presentation, ED course, and plan of care discussed with review of all pertinent labs and imaging. Please see his/her note for further details regarding further ED course and disposition.   Final Clinical Impression(s) / ED Diagnoses Final diagnoses:  Injury of left knee, initial encounter    Rx / DC Orders ED Discharge Orders    None       Tracyann Duffell A, PA-C 07/12/19 0355    Arby Barrette, MD 07/12/19 9741

## 2019-07-11 NOTE — ED Triage Notes (Signed)
Pt c/o left knee pain and weakness for "a while" pt has been unable to get in touch with her ortho doctor. Reports today her left knee "gave out", pt reports that she did not fall completely.

## 2019-07-12 DIAGNOSIS — S80912D Unspecified superficial injury of left knee, subsequent encounter: Secondary | ICD-10-CM | POA: Diagnosis not present

## 2019-07-12 LAB — BASIC METABOLIC PANEL
Anion gap: 10 (ref 5–15)
BUN: 13 mg/dL (ref 8–23)
CO2: 22 mmol/L (ref 22–32)
Calcium: 9.3 mg/dL (ref 8.9–10.3)
Chloride: 101 mmol/L (ref 98–111)
Creatinine, Ser: 0.67 mg/dL (ref 0.44–1.00)
GFR calc Af Amer: >60 mL/min (ref >60)
GFR calc non Af Amer: >60 mL/min (ref >60)
Glucose, Bld: 118 mg/dL — ABNORMAL HIGH (ref 70–99)
Potassium: 4.4 mmol/L (ref 3.5–5.1)
Sodium: 133 mmol/L — ABNORMAL LOW (ref 135–145)

## 2019-07-12 LAB — CBC
HCT: 47.5 % — ABNORMAL HIGH (ref 36.0–46.0)
Hemoglobin: 15.6 g/dL — ABNORMAL HIGH (ref 12.0–15.0)
MCH: 29.9 pg (ref 26.0–34.0)
MCHC: 32.8 g/dL (ref 30.0–36.0)
MCV: 91.2 fL (ref 80.0–100.0)
Platelets: 257 10*3/uL (ref 150–400)
RBC: 5.21 MIL/uL — ABNORMAL HIGH (ref 3.87–5.11)
RDW: 13.5 % (ref 11.5–15.5)
WBC: 6.8 10*3/uL (ref 4.0–10.5)
nRBC: 0 % (ref 0.0–0.2)

## 2019-07-12 LAB — URINALYSIS, ROUTINE W REFLEX MICROSCOPIC
Bilirubin Urine: NEGATIVE
Glucose, UA: NEGATIVE mg/dL
Hgb urine dipstick: NEGATIVE
Ketones, ur: 5 mg/dL — AB
Nitrite: NEGATIVE
Protein, ur: NEGATIVE mg/dL
Specific Gravity, Urine: 1.015 (ref 1.005–1.030)
pH: 6 (ref 5.0–8.0)

## 2019-07-12 LAB — SARS CORONAVIRUS 2 BY RT PCR (HOSPITAL ORDER, PERFORMED IN ~~LOC~~ HOSPITAL LAB): SARS Coronavirus 2: NEGATIVE

## 2019-07-12 MED ORDER — ROPINIROLE HCL 0.25 MG PO TABS
0.2500 mg | ORAL_TABLET | Freq: Every day | ORAL | Status: DC
  Administered 2019-07-13: 0.25 mg via ORAL
  Filled 2019-07-12 (×2): qty 1

## 2019-07-12 MED ORDER — TRAZODONE HCL 50 MG PO TABS
50.0000 mg | ORAL_TABLET | Freq: Every day | ORAL | Status: DC
  Administered 2019-07-13 (×2): 50 mg via ORAL
  Filled 2019-07-12 (×3): qty 1

## 2019-07-12 MED ORDER — SPIRONOLACTONE 25 MG PO TABS
50.0000 mg | ORAL_TABLET | Freq: Every day | ORAL | Status: DC
  Administered 2019-07-12 – 2019-07-13 (×2): 50 mg via ORAL
  Filled 2019-07-12 (×3): qty 2

## 2019-07-12 MED ORDER — POLYVINYL ALCOHOL 1.4 % OP SOLN
1.0000 [drp] | Freq: Two times a day (BID) | OPHTHALMIC | Status: DC | PRN
  Filled 2019-07-12: qty 15

## 2019-07-12 MED ORDER — ROPINIROLE HCL 0.25 MG PO TABS
0.2500 mg | ORAL_TABLET | Freq: Once | ORAL | Status: AC
  Administered 2019-07-13: 0.25 mg via ORAL
  Filled 2019-07-12: qty 1

## 2019-07-12 MED ORDER — ACETAMINOPHEN 500 MG PO TABS
1000.0000 mg | ORAL_TABLET | Freq: Four times a day (QID) | ORAL | Status: DC | PRN
  Administered 2019-07-12: 1000 mg via ORAL
  Filled 2019-07-12: qty 2

## 2019-07-12 MED ORDER — POTASSIUM CHLORIDE CRYS ER 20 MEQ PO TBCR
10.0000 meq | EXTENDED_RELEASE_TABLET | Freq: Every day | ORAL | Status: DC
  Filled 2019-07-12 (×2): qty 1

## 2019-07-12 MED ORDER — GABAPENTIN 100 MG PO CAPS
100.0000 mg | ORAL_CAPSULE | Freq: Every day | ORAL | Status: DC
  Filled 2019-07-12: qty 1

## 2019-07-12 MED ORDER — LORATADINE 10 MG PO TABS
10.0000 mg | ORAL_TABLET | Freq: Every day | ORAL | Status: DC
  Filled 2019-07-12: qty 1

## 2019-07-12 MED ORDER — POLYETHYL GLYCOL-PROPYL GLYCOL 0.4-0.3 % OP GEL: 1.0000 "application " | Freq: Two times a day (BID) | OPHTHALMIC | Status: DC | PRN

## 2019-07-12 NOTE — Discharge Planning (Signed)
Transition of Care [facility]) - Emergency Department Mini Assessment   Patient Details  Name: Angela Dudley MRN: [id number] Date of Birth: 15-Dec-1945  [facility] CM/SW Contact:    Oletta Cohn, RN Phone Number: 07/12/2019, 8:27 AM   Clinical Narrative: RNCM consulted regarding skilled nursing facility placement vs home with home health services.  Awaiting physical therapy evaluation to aid in determining disposition placement.   ED Mini Assessment: What brought you to the Emergency Department? : fall  Barriers to Discharge: ED SNF auth        Interventions which prevented an admission or readmission: SNF Placement    Patient Contact and Communications        ,          Patient states their goals for this hospitalization and ongoing recovery are:: feel steady on my feet      Admission diagnosis:  Knee pain Patient Active Problem List   Diagnosis Date Noted  . Near syncope 05/31/2019  . Gait instability 05/31/2019  . Suspected COVID-19 virus infection 07/22/2018  . Dyspnea on exertion 07/04/2018  . Vitreous membranes and strands of right eye 11/30/2017  . Trigger finger of right thumb 10/19/2016  . Sleep apnea in adult 08/20/2016  . Cervicogenic headache 06/08/2016  . Cervical spondylosis 07/23/2014  . Cervical spondylosis with radiculopathy 10/16/2013  . Back pain 08/30/2012  . VITAMIN B12 DEFICIENCY 09/04/2009  . HYPERLIPIDEMIA 09/04/2009  . OBESITY 09/04/2009  . Allergic rhinitis 09/04/2009  . GERD 09/04/2009  . OSTEOARTHRITIS 09/04/2009  . DEGENERATIVE DISC DISEASE 09/04/2009  . WART, VIRAL 08/15/2009  . UNSPECIFIED HYPOTHYROIDISM 08/15/2009  . BACK PAIN 08/15/2009  . MUSCLE CRAMPS 08/15/2009  . ABDOMINAL PAIN, GENERALIZED 12/13/2006  . SARCOIDOSIS 10/13/2006  . 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 ULCER, PEPTIC NOS W/O HEM/PERF W/O OBST 10/13/2006  . SMALL BOWEL OBSTRUCTION 10/13/2006  . DIVERTICULOSIS, COLON W/O  HEM 10/13/2006  . ADHESIONS, PERITONEAL 10/13/2006  . CHRONIC FATIGUE SYNDROME 10/13/2006  . SYMPTOM, HEADACHE 10/13/2006   PCP:  Oletha Blend, MD Pharmacy:   [facility] - [place], [place] - [address] [place] [address] Phone: [phone] Fax: [phone]  [facility] - [place], [place] - [address] [facility] [address] [place] [address] Phone: [phone] Fax: [phone]

## 2019-07-12 NOTE — Progress Notes (Signed)
CSW faxed out information for SNF placement to several area facilities closest to the Pt.  Pt's preferred facility would be [facility] [phone].  [facility] is not in the hub so CSW hand faxed the info to the admissions department @ [phone].

## 2019-07-12 NOTE — Social Work (Signed)
CSW met with Pt at bedside. Pt states that she is waiting for for a call back from her medical power of attorney as well as her PCP in regards to getting an MRI on her knee. Pt states that she questions the course of action of rehab without an MRI.  CSW will attempt to gather more information and report back to Pt.

## 2019-07-12 NOTE — Progress Notes (Signed)
Pt is demonstrating both ligamentous instability, lateral compartment pain and patellar malalignment on L knee, with a new brace that is steadying but not resolving her pain issues.  Follow acutely for progression of mobiltiy and strength, pain reduction and increasing tolerance for stair challenges, with SNF recommended due to the large geographical obstacles at home.  Pt is unable to get to food or get in and out of her house safely.    07/12/19 1100  PT Visit Information  Last PT Received On 07/12/19  Assistance Needed +1 (2 if attempting stairs)  History of Present Illness 74 yo female with onset of L knee pain and loss of independent gait was brought to ED.  Pain has increased since Jan 2021, when a meniscal tear was dx'd.  Had buckling on L knee today, while trying to climb up steps. Was fitted with new lateral/medial sleeve which is not helpful for pain, but is controlling instability of collateral ligaments. Has SPC but requires RW currently.  PMHx: chronic fatigue, sarcoidosis, B knee OA, PUD, L medial meniscal tear, IBS, asthma, peripheral neuropathy, HA, L CTS, Suspected Covid 19  Precautions  Precautions Fall  Precaution Comments L knee pain  Required Braces or Orthoses Other Brace (L knee sleeve with lat/med stays)  Restrictions  Weight Bearing Restrictions No  Home Living  Family/patient expects to be discharged to: Private residence  Living Arrangements Alone  Type of Home House  Home Access Stairs to enter  Entrance Stairs-Number of Steps 2  Entrance Stairs-Rails Right;Left  Home Layout Two level  Alternate Level Stairs-Number of Steps 10  Alternate Level Stairs-Rails Right;Left;Can reach both  Dealer - single point  Prior Function  Level of Independence Needs assistance  Gait / Transfers Assistance Needed recently added SPC, walking very slowly with L knee pain and buckling  ADL's / Homemaking Assistance Needed has been home  managing her meals with extra time, doing own shopping  Communication  Communication No difficulties  Pain Assessment  Pain Assessment Faces  Faces Pain Scale 6  Pain Location L knee with lateral compression  Pain Descriptors / Indicators Grimacing;Guarding;Sharp  Pain Intervention(s) Limited activity within patient's tolerance;Monitored during session;Repositioned  Cognition  Arousal/Alertness Awake/alert  Behavior During Therapy WFL for tasks assessed/performed  Overall Cognitive Status Within Functional Limits for tasks assessed  General Comments able to report on lengthy history  Upper Extremity Assessment  Upper Extremity Assessment Defer to OT evaluation  Lower Extremity Assessment  Lower Extremity Assessment Generalized weakness;LLE deficits/detail  LLE Deficits / Details collateral instability med>lateral, pain to resist hamstrings with lateral attachment edema but not point tenderness  LLE Coordination decreased gross motor  Cervical / Trunk Assessment  Cervical / Trunk Assessment Kyphotic  Bed Mobility  General bed mobility comments in chair at eval  Transfers  Overall transfer level Modified independent  Equipment used Rolling walker (2 wheeled);1 person hand held assist  General transfer comment sit to stand with extra time and awkward  Ambulation/Gait  Ambulation/Gait assistance Min guard  Gait Distance (Feet) 20 Feet  Assistive device Rolling walker (2 wheeled);1 person hand held assist (1 to push chair)  Gait Pattern/deviations Step-to pattern;Step-through pattern;Decreased stride length;Decreased stance time - left;Decreased weight shift to left;Wide base of support;Trunk flexed  General Gait Details tends to ER on LLE, avoiding posterior stress on hamstrings and shift onto L lateral knee  Gait velocity reduced  Gait velocity interpretation <1.31 ft/sec, indicative of household ambulator  Balance  Overall  balance assessment Needs assistance  Sitting-balance  support Feet supported  Sitting balance-Leahy Scale Fair  Standing balance support Bilateral upper extremity supported;During functional activity  Standing balance-Leahy Scale Poor  General Comments  General comments (skin integrity, edema, etc.) pt is avoiding WB on lateral L knee, has pain and sensitivity on lateral knee, conmpartment and hamstrings, but is very stable regarding collateral ligs  Exercises  Exercises Other exercises (L hamstrings 3+)  PT - End of Session  Equipment Utilized During Treatment Left knee immobilizer  Activity Tolerance Patient limited by fatigue;Patient limited by pain;Treatment limited secondary to medical complications (Comment)  Patient left in chair;with call bell/phone within reach;with nursing/sitter in room  Nurse Communication Mobility status  PT Assessment  PT Recommendation/Assessment Patient needs continued PT services  PT Visit Diagnosis Unsteadiness on feet (R26.81);Muscle weakness (generalized) (M62.81);Pain  Pain - Right/Left Left  Pain - part of body Knee  PT Problem List Decreased strength;Decreased range of motion;Decreased activity tolerance;Decreased balance;Decreased mobility;Decreased coordination;Pain  Barriers to Discharge Inaccessible home environment;Decreased caregiver support  Barriers to Discharge Comments home alone and 10 steps to get food  PT Plan  PT Frequency (ACUTE ONLY) Min 3X/week  PT Treatment/Interventions (ACUTE ONLY) DME instruction;Gait training;Stair training;Functional mobility training;Therapeutic activities;Therapeutic exercise;Balance training;Neuromuscular re-education;Patient/family education  AM-PAC PT "6 Clicks" Mobility Outcome Measure (Version 2)  Help needed turning from your back to your side while in a flat bed without using bedrails? 3  Help needed moving from lying on your back to sitting on the side of a flat bed without using bedrails? 3  Help needed moving to and from a bed to a chair (including a  wheelchair)? 3  Help needed standing up from a chair using your arms (e.g., wheelchair or bedside chair)? 3  Help needed to walk in hospital room? 3  Help needed climbing 3-5 steps with a railing?  1  6 Click Score 16  Consider Recommendation of Discharge To: Home with [facility]  PT Recommendation  Follow Up Recommendations SNF  PT equipment Rolling walker with 5" wheels  Individuals Consulted  Consulted and Agree with Results and Recommendations Patient  Acute Rehab PT Goals  Patient Stated Goal to get L knee pain resolved  PT Goal Formulation With patient  Time For Goal Achievement 07/26/19  Potential to Achieve Goals Good  PT Time Calculation  PT Start Time (ACUTE ONLY) 1000  PT Stop Time (ACUTE ONLY) 1036  PT Time Calculation (min) (ACUTE ONLY) 36 min  PT General Charges  $$ ACUTE PT VISIT 1 Visit  PT Evaluation  $PT Eval Moderate Complexity 1 Mod  Written Expression  Dominant Hand Right    Samul Dada, PT MS Acute Rehab Dept. Number: [facility] [id number] and [facility] [phone]

## 2019-07-12 NOTE — ED Notes (Addendum)
Patient to room. RN suggested pt get in gown and in bed to be able to be more comfortable. Bedside commode at bedside  and RN suggested purwick since mobility an issue. Pt refusing to get in gown. States she is most comfortable in recliner. Pt insisting on being wheeled to restroom each time. She reports she has IBS and frequent BM especially since hospital food is irritating to her stomach. RN again discussed that bedside commode would be safest option. RN discussed that she needs pain management in order to help with ambulation issues. She states she has never even said she is in pain. She is refusing. RN discussed that PT evaluation suggests SNF safest option for her. She states she needs MRI to find out problem with knee and wants Dr. Sherlean Foot called. PA aware.

## 2019-07-12 NOTE — ED Notes (Signed)
The case manager just finished talking to her

## 2019-07-12 NOTE — ED Notes (Signed)
Patient refused to use the bedside toilet offered for patient to have BM; staff explained concerned due to high fall risk and weakness in legs; Patient was eventually taken in the bathroom via Recliner chair and took 2 x assist to stand and get on toilet; Patient was explained the care of her not falling and to be able to get to the same out come in a safe manner for her and staff; Pt was placed back in  Her room in recliner where she still refused to be placed in gown for comfort; Pt states she sleep in chair at home and will not get in bed; staff offered to place perwick on patient for comfort and convince for frequent urination patient declined; Pt also c/o about not receiving pain med but could not state if she has regular pain meds prescribed; RN advised patient that she can ask EDP for anti inflammatory or Tyelonl patient states "I can not take that". Patient could not state what she takes at home for PRN pain; Tylenol already ordered PRN for pain.-Monique,RN

## 2019-07-12 NOTE — ED Notes (Signed)
SW at bedside to talk to pt.

## 2019-07-12 NOTE — ED Provider Notes (Signed)
I assumed care of patient at shift change from previous team. Plan to follow uyp on PT/OT, order home meds. PT OT recommends SNF.  Social work is made aware.  Home meds are reordered.  Patient was seen resting comfortably in no distress.    Note: Portions of this report may have been transcribed using voice recognition software. Every effort was made to ensure accuracy; however, inadvertent computerized transcription errors may be present        Cristina Gong, PA-C 07/12/19 1509    Alvira Monday, MD 07/13/19 2237

## 2019-07-12 NOTE — Progress Notes (Signed)
Orthopedic Tech Progress Note Patient Details:  SHAROLYN WEBER 1945/06/15 [id number]  Ortho Devices Type of Ortho Device: Knee Sleeve Ortho Device/Splint Location: LLE Ortho Device/Splint Interventions: Application, Ordered, Adjustment   Post Interventions Patient Tolerated: Well Instructions Provided: Care of device, Adjustment of device, Poper ambulation with device   Joy Reiger 07/12/2019, 12:18 AM

## 2019-07-12 NOTE — ED Notes (Signed)
SW talking to patient

## 2019-07-12 NOTE — Progress Notes (Signed)
CSW met with Pt at bedside. After discussion with Pt and lawyer via speaker phone. Pt decided to go to SNF. CSW will begin that process.

## 2019-07-12 NOTE — Evaluation (Signed)
Occupational Therapy Evaluation Patient Details Name: Angela Dudley MRN: [id number] DOB: 1945/10/06 Today's Date: 07/12/2019    History of Present Illness 74 yo female with onset of L knee pain and loss of independent gait was brought to ED.  Pain has increased since Jan 2021, when a meniscal tear was dx'd.  Had buckling on L knee 07/11/19, while trying to climb up steps. Pt was provided this admission lateral/medial sleeve which is not helpful for pain, but is controlling instability of collateral ligaments.  Pt used occassionally SPC but requires RW currently.  PMHx: chronic fatigue, sarcoidosis, B knee OA, PUD, L medial meniscal tear, IBS, asthma, peripheral neuropathy, HA, L CTS, Suspected Covid 19 (P)    Clinical Impression   Pt is UNABLE TO APPLY BRACE at this time. Pt will requires (A) for current brace. Pt needs a brace that allows for anterior closure to be independent with application ( KI brace has this style of closure but still with Stays for stability of the knee). Please consider ordering a brace if patient is to d/c from [facility]. Pt lives alone and is high risk for fall, food insecurity and infections due to hygiene. Pt will not be able to transfer into a shower based on this session. Pt if able to transfer 2 steps into the home will not be able to transfer 10 steps up to the kitchen on the second floor. Pt has no assistance per her self report during session. Recommend SNF at this time pending acute admission testing/ procedures. Pt requesting MRI of L knee and advised to discuss with medical MD following. Pt requesting to speak with Dr Sherlean Foot concerning how to proceed with her plan of care. If patient is not accepted into a SNF- please have CM provide Aide, home nurse, HHOT and transportation resources.     Follow Up Recommendations  SNF    Equipment Recommendations  3 in 1 bedside commode;Other (comment) (RW/ new brace for L knee that pt can DON)    Recommendations for Other Services  Other (comment) (as many home services as possible if not SNF bound)     Precautions / Restrictions Precautions Precautions: Fall Precaution Comments: L knee pain Required Braces or Orthoses: Other Brace (L knee sleeve with lat/med stays)      Mobility Bed Mobility               General bed mobility comments: in chair in hall on arrival  Transfers Overall transfer level: Needs assistance Equipment used: Rolling walker (2 wheeled);1 person hand held assist Transfers: Sit to/from Stand Sit to Stand: Supervision         General transfer comment: pt requiers increased time and effort to achieve standing with superivision. pt with uncontrolled descend to chair and immediate pain    Balance Overall balance assessment: Needs assistance Sitting-balance support: Feet supported Sitting balance-Leahy Scale: Fair     Standing balance support: Bilateral upper extremity supported;During functional activity Standing balance-Leahy Scale: Poor Standing balance comment: reliance on RW                           ADL either performed or assessed with clinical judgement   ADL Overall ADL's : Needs assistance/impaired Eating/Feeding: Independent Eating/Feeding Details (indicate cue type and reason): eating bacon and french toast Grooming: Wash/dry hands;Modified independent;Sitting Grooming Details (indicate cue type and reason): requires seated position. pt requires BIL UE when standing  Lower Body Dressing: Moderate assistance;Sitting/lateral leans Lower Body Dressing Details (indicate cue type and reason): pt attempting to doff knee sleeve and unable to push it completely off the knee. Pt can not reach feet to don sleeve. Pt will require increased (A) to don doff the sleeve provided to the patient at this time Toilet Transfer: Minimal assistance;RW           Functional mobility during ADLs: Minimal assistance;Rolling walker General ADL Comments: Pt  requires increased time and effort to power up from chair. pt with uncontrolled descend to chair.      Vision         Perception     Praxis      Pertinent Vitals/Pain Pain Assessment: Faces Faces Pain Scale: Hurts even more Pain Location: L knee with lateral compression Pain Descriptors / Indicators: Grimacing;Guarding;Sharp Pain Intervention(s): Monitored during session;Premedicated before session;Repositioned     Hand Dominance Right   Extremity/Trunk Assessment Upper Extremity Assessment Upper Extremity Assessment: RUE deficits/detail;LUE deficits/detail RUE Deficits / Details: reports need for surgery in shoulder but unable to have completed due to no (A) for post surgery LUE Deficits / Details: reports hx of pain in shoulder    Lower Extremity Assessment Lower Extremity Assessment: Defer to PT evaluation LLE Deficits / Details: Pt noted to have external rotation of the foot and with cues able to correct foot without additional pain. pt noted to have increased edema to the knee compared to R   Cervical / Trunk Assessment Cervical / Trunk Assessment: Kyphotic   Communication Communication Communication: No difficulties   Cognition Arousal/Alertness: Awake/alert Behavior During Therapy: WFL for tasks assessed/performed Overall Cognitive Status: Within Functional Limits for tasks assessed                                 General Comments: able to report on lengthy history   General Comments  Pt with noticeable edema compared to R LE. pt facial grimace to any tactile input. PT CANNOT DON OR DOFF BRACE at this time. Pt requires (A) to apply the brace. pt could benefit from an open application brace that then adjust with straps such as a KI style    Exercises Exercises:  (L hamstrings 3+)   Shoulder Instructions      Home Living Family/patient expects to be discharged to:: Private residence Living Arrangements: Alone Available Help at Discharge: Other  (Comment) (NONE) Type of Home: House Home Access: Stairs to enter [employer] of Steps: 2 Entrance Stairs-Rails: Right;Left Home Layout: Two level Alternate Level Stairs-Number of Steps: 10 Alternate Level Stairs-Rails: Right;Left;Can reach both Bathroom Shower/Tub: Producer, television/film/video: Standard Bathroom Accessibility: No   Home Equipment: Cane - single point   Additional Comments: pt reports that when she needs help she calls 911 or her POA Attorney because she has no additional (A) from anyone.       Prior Functioning/Environment Level of Independence: Needs assistance  Gait / Transfers Assistance Needed: recently added SPC, walking very slowly with L knee pain and buckling ADL's / Homemaking Assistance Needed: has been home managing her meals with extra time, doing own shopping and driving. pt reports last trip to the store was Thursday 6/30 and it took her extended time and extreme pain to get the cold items from the car.             OT Problem List: Decreased strength;Decreased activity tolerance;Impaired balance (  sitting and/or standing);Decreased safety awareness;Decreased knowledge of use of DME or AE;Pain;Decreased knowledge of precautions      OT Treatment/Interventions: Self-care/ADL training;Therapeutic exercise;DME and/or AE instruction;Therapeutic activities;Balance training;Patient/family education;Modalities;Manual therapy;Energy conservation    OT Goals(Current goals can be found in the care plan section) Acute Rehab OT Goals Patient Stated Goal: to get to talk to Dr Sherlean Foot OT Goal Formulation: With patient Time For Goal Achievement: 07/26/19 Potential to Achieve Goals: Good  OT Frequency: Min 2X/week   Barriers to D/C: Decreased caregiver support  lives alone with no A       Co-evaluation PT/OT/SLP Co-Evaluation/Treatment: Yes Reason for Co-Treatment: To address functional/ADL transfers   OT goals addressed during session:  ADL's and self-care;Proper use of Adaptive equipment and DME      AM-PAC OT "6 Clicks" Daily Activity     Outcome Measure Help from another person eating meals?: None Help from another person taking care of personal grooming?: A Little Help from another person toileting, which includes using toliet, bedpan, or urinal?: A Lot Help from another person bathing (including washing, rinsing, drying)?: A Lot Help from another person to put on and taking off regular upper body clothing?: None Help from another person to put on and taking off regular lower body clothing?: A Lot 6 Click Score: 17   End of Session Equipment Utilized During Treatment: Rolling walker;Other (comment) (L knee sleeve with stays) Nurse Communication: Mobility status;Precautions  Activity Tolerance: Patient limited by pain Patient left: in chair;with call bell/phone within reach;Other (comment) (in chair in hall with RN at RN station)  OT Visit Diagnosis: Unsteadiness on feet (R26.81);Pain Pain - Right/Left: Left Pain - part of body: Knee                Time: 3559-7416 OT Time Calculation (min): 37 min Charges:  OT General Charges $OT Visit: 1 Visit OT Evaluation $OT Eval Moderate Complexity: 1 Mod   Brynn, OTR/L  Acute Rehabilitation Services Pager: [phone] Office: [phone] .   Mateo Flow 07/12/2019, 3:08 PM

## 2019-07-12 NOTE — ED Provider Notes (Signed)
CSW has been working with patient regarding plan for dc to SNF. Patient has refused to go to SNF without an MRI of her knee with reason given that she may be doing more harm than good when the problem with her knee has not been evaluated by MRI. I have reviewed patient's recent ortho records as well as knee MRI from 2019. Patient has a known meniscus tear, has called her ortho office- Dr. Sherlean Foot with [facility] [place]- to report worsening pain. Per records, patient is unable to have her surgery at a hospital (surgery offered is a knee scope and does not require hospitalization) and is unable to care for herself at home following surgery. Office has offered to refer patient to another orthopedic provider who may be able to do her surgery in the hospital.  I have left a message with Dr. Tobin Chad office requesting call back to discuss her request for MRI or any other options for her treatment. It does not seem like an MRI is warranted emergently, per record on arrival, patient's knee buckled and she had worsening pain, no acute injury otherwise. Patient does not want to talk to any PAs.   Case discussed with Laurier Nancy, PA-C with Dr. Tobin Chad office, patient does not need an MRI, can go to SNF and follow up outpatient.    Jeannie Fend, PA-C 07/12/19 Nolen Mu    Arby Barrette, MD 07/13/19 Moses Manners

## 2019-07-12 NOTE — ED Notes (Signed)
requip requested from pharmacy

## 2019-07-12 NOTE — Progress Notes (Signed)
OT NOTE  Formal evaluation to follow. Pt is unable to d/c home alone without any assistance available. Pt with increased swelling of the L knee and max (A) to don doff new brace. Pt reports that she would like to have an MRI and someone to contact Dr Sherlean Foot office on her behalf.  Recommend SNF for d/c planning at this time due to fall risk and no available assistance.   Timmothy Euler, OTR/L  Acute Rehabilitation Services Pager: [phone] Office: [phone] .

## 2019-07-12 NOTE — ED Notes (Signed)
The pt has numerus complaints she is eating her dinner  Which she is not fond of  Will not get in the bed because its too much effort to get up and down to use the br.  She is in a recliner  Call bell at her side  Given a remote that is working  [employer] a pure wick if she can get into the bed she will not use that

## 2019-07-12 NOTE — NC FL2 (Signed)
[place] MEDICAID FL2 LEVEL OF CARE SCREENING TOOL     IDENTIFICATION  Patient Name: Angela Dudley Birthdate: 04-May-1945 Sex: female Admission Date (Current Location): 07/11/2019  [facility] and [place] Number:  Producer, television/film/video and Address:  [facility] [place], [address] [place], [place] [address]      Provider Number: [id number]  Attending Physician Name and Address:  Default, Provider, MD  Relative Name and Phone Number:  Lawyer/Healthcare Power of [facility] [phone]    Current Level of Care: Hospital Recommended Level of Care: Skilled Nursing Facility Prior Approval Number:    Date Approved/Denied:   PASRR Number: [id number]  Discharge Plan: SNF    Current Diagnoses: Patient Active Problem List   Diagnosis Date Noted  . Near syncope 05/31/2019  . Gait instability 05/31/2019  . Suspected COVID-19 virus infection 07/22/2018  . Dyspnea on exertion 07/04/2018  . Vitreous membranes and strands of right eye 11/30/2017  . Trigger finger of right thumb 10/19/2016  . Sleep apnea in adult 08/20/2016  . Cervicogenic headache 06/08/2016  . Cervical spondylosis 07/23/2014  . Cervical spondylosis with radiculopathy 10/16/2013  . Back pain 08/30/2012  . VITAMIN B12 DEFICIENCY 09/04/2009  . HYPERLIPIDEMIA 09/04/2009  . OBESITY 09/04/2009  . Allergic rhinitis 09/04/2009  . GERD 09/04/2009  . OSTEOARTHRITIS 09/04/2009  . DEGENERATIVE DISC DISEASE 09/04/2009  . WART, VIRAL 08/15/2009  . UNSPECIFIED HYPOTHYROIDISM 08/15/2009  . BACK PAIN 08/15/2009  . MUSCLE CRAMPS 08/15/2009  . ABDOMINAL PAIN, GENERALIZED 12/13/2006  . SARCOIDOSIS 10/13/2006  . 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 ULCER, PEPTIC NOS W/O HEM/PERF W/O OBST 10/13/2006  . SMALL BOWEL OBSTRUCTION 10/13/2006  . DIVERTICULOSIS, COLON W/O HEM 10/13/2006  . ADHESIONS, PERITONEAL 10/13/2006  . CHRONIC FATIGUE SYNDROME 10/13/2006  . SYMPTOM, HEADACHE  10/13/2006    Orientation RESPIRATION BLADDER Height & Weight     Self, Time, Situation, Place  Normal Continent Weight:   Height:     BEHAVIORAL SYMPTOMS/MOOD NEUROLOGICAL BOWEL NUTRITION STATUS      Continent Diet (Regular)  AMBULATORY STATUS COMMUNICATION OF NEEDS Skin   Extensive Assist Verbally Normal                       Personal Care Assistance Level of Assistance  Bathing, Feeding, Dressing Bathing Assistance: Limited assistance Feeding assistance: Independent Dressing Assistance: Limited assistance     Functional Limitations Info  Sight, Hearing, Speech Sight Info: Adequate Hearing Info: Adequate Speech Info: Adequate    SPECIAL CARE FACTORS FREQUENCY  PT (By licensed PT), OT (By licensed OT)     PT Frequency: 5 x weekly OT Frequency: 5x weekly            Contractures Contractures Info: Not present    Additional Factors Info  Code Status, Allergies Code Status Info: Full Allergies Info: Amoxicillin,Hepatotoxic Gases,Decadron,Esomeprazole Magnesium,Percocet           Current Medications (07/12/2019):  This is the current hospital active medication list Current Facility-Administered Medications  Medication Dose Route Frequency Provider Last Rate Last Admin  . acetaminophen (TYLENOL) tablet 1,000 mg  1,000 mg Oral Q6H PRN Cristina Gong, PA-C      . gabapentin (NEURONTIN) capsule 100 mg  100 mg Oral Daily Lyndel Safe W, PA-C      . loratadine (CLARITIN) tablet 10 mg  10 mg Oral Daily Lyndel Safe W, PA-C      . polyvinyl alcohol (LIQUIFILM TEARS) 1.4 %  ophthalmic solution 1 drop  1 drop Both Eyes BID PRN Cristina Gong, PA-C      . potassium chloride SA (KLOR-CON) CR tablet 10 mEq  10 mEq Oral QHS Lyndel Safe W, PA-C      . rOPINIRole (REQUIP) tablet 0.25 mg  0.25 mg Oral QHS Lyndel Safe W, PA-C      . spironolactone (ALDACTONE) tablet 50 mg  50 mg Oral Q1500 Cristina Gong, PA-C      . traZODone  (DESYREL) tablet 50 mg  50 mg Oral QHS Cristina Gong, [place]       Current Outpatient Medications  Medication Sig Dispense Refill  . acetaminophen (TYLENOL) 500 MG tablet Take 1,000 mg by mouth every 6 (six) hours as needed for moderate pain or headache.    . B Complex-C-Folic Acid (STRESS B COMPLEX PO) Take 1 tablet by mouth daily.    . Biotin 10 MG CAPS Take 10 mg by mouth daily.     Marland Kitchen Black Elderberry (SAMBUCUS ELDERBERRY PO) Take 1,250 mg by mouth daily.    . cetirizine (ZYRTEC) 10 MG tablet Take 10 mg by mouth at bedtime.     . Chromium Picolinate 500 MCG CAPS Take 500 mcg by mouth daily.    . Coenzyme Q10 (COQ-10 PO) Take 120 mg by mouth daily.    Marland Kitchen ECHINACEA PO Take 1 capsule by mouth daily.    Marland Kitchen gabapentin (NEURONTIN) 100 MG capsule Take 2 capsules (200 mg total) by mouth 2 (two) times daily. (Patient taking differently: Take 100 mg by mouth daily. ) 360 capsule 3  . Ginger, Zingiber officinalis, (GINGER ROOT) 550 MG CAPS Take 1,100 mg by mouth 3 (three) times daily.     . Glucosamine-Chondroitin (GLUCOSAMINE CHONDR COMPLEX PO) Take 1 capsule by mouth daily.    Marland Kitchen LECITHIN PO Take 2-4 tablets by mouth See admin instructions. Take 4 tablets at night, may take a 2 to 4 tablets as needed to continue sleep if woken up    . Magnesium 500 MG TABS Take 1,000 mg by mouth at bedtime.     . Omega-3 Fatty Acids (OMEGA 3 PO) Take 1,360 mg by mouth daily.    Bertram Gala Glycol-Propyl Glycol (SYSTANE) 0.4-0.3 % GEL ophthalmic gel Place 1 application into both eyes 2 (two) times daily as needed (dry eyes).    . Potassium 99 MG TABS Take 495 mg by mouth at bedtime as needed (muscle cramps).     . potassium chloride SA (K-DUR,KLOR-CON) 20 MEQ tablet Take 1 tablet (20 mEq total) by mouth daily. (Patient taking differently: Take 10 mEq by mouth at bedtime. ) 90 tablet 1  . rOPINIRole (REQUIP) 0.25 MG tablet Take 0.25 mg by mouth at bedtime.    . Selenium 200 MCG CAPS Take 200 mcg by mouth daily.      Marland Kitchen spironolactone (ALDACTONE) 50 MG tablet Take 50 mg by mouth daily in the afternoon.    . traZODone (DESYREL) 50 MG tablet Take 50 mg by mouth at bedtime.     . TURMERIC PO Take 800 mg by mouth 3 (three) times daily.    . Zinc 50 MG TABS Take 50 mg by mouth daily.        Discharge Medications: Please see discharge summary for a list of discharge medications.  Relevant Imaging Results:  Relevant Lab Results:   Additional Information SSN# [id number]  Shella Spearing, LCSW

## 2019-07-13 DIAGNOSIS — S80912D Unspecified superficial injury of left knee, subsequent encounter: Secondary | ICD-10-CM | POA: Diagnosis not present

## 2019-07-13 MED ORDER — GABAPENTIN 100 MG PO CAPS
100.0000 mg | ORAL_CAPSULE | Freq: Every day | ORAL | Status: DC
  Administered 2019-07-13: 100 mg via ORAL
  Filled 2019-07-13: qty 1

## 2019-07-13 MED ORDER — CETIRIZINE HCL 10 MG PO TABS
10.0000 mg | ORAL_TABLET | Freq: Every day | ORAL | Status: DC
  Filled 2019-07-13 (×2): qty 1

## 2019-07-13 NOTE — ED Notes (Signed)
Social Worker at bedside.

## 2019-07-13 NOTE — ED Notes (Signed)
Patient was transport to the bathroom in recliner; once patient was seated on the toilet the pt states she is unable to stand on her own; NT had to get second tech for assistance in standing patient to pivot in chair; Patient continues to decline using bedside comode or perwick to reduce amount of times she is putting pressure on knee; Patient has been safely placed back in recliner and and back in room-Monique,RN

## 2019-07-13 NOTE — ED Notes (Signed)
PT says she prefers Zyrtec instead of Claritin  She also request her Gabapentin changed from morning to night time. -request sent to Pharmacist

## 2019-07-13 NOTE — ED Notes (Signed)
PT's allergy med switch to Zyrtec as requested -Presented medication to patient She says she does not need it today

## 2019-07-13 NOTE — Progress Notes (Addendum)
2pm: CSW sent patient's clinical information to additional facilities for review.  1:30pm: CSW spoke with Lowella Bandy at [facility] who states the facility is unable to offer a bed at this time.  CSW attempted to reach Marylene Land at [facility] to discuss this patient - sent text message requesting a return call.  12:40pm: CSW attempted to reach admissions at [facility] without success - a voicemail was left requesting a return call.  CSW attempted to reach admissions at [facility] without success - a voicemail was left requesting a return call.   2pm: CSW spoke with patient at bedside to discuss her discharge plan. CSW informed patient that [facility] was unable to make a bed offer for her. Patient called her POA and lawyer Misty to be included in on the discussion. Misty reports she reached out to the administrator at [facility] to discuss this patient.  Misty and the patient agreeable for CSW to broaden search for SNF placement to [place], [place], and [place] areas.  Durward Mallard, RN CM joined the [employer] - all questions were answered regarding SNF versus home health services.  10:40am: CSW received return call from [place] at [facility] who states the facility is unable to offer the patient a bed at this time.   8am: CSW attempted to reach Mia Cochrane in admissions at [facility] in [place] without success - a voicemail was left requesting a return call.  Edwin Dada, MSW, LCSW-A Transitions of Care  Clinical Social Worker  [facility] Emergency Departments  Medical ICU [phone]

## 2019-07-13 NOTE — ED Provider Notes (Signed)
11:44 AM BP 136/64 (BP Location: Left Arm)   Pulse 85   Temp 97.8 F (36.6 C) (Oral)   Resp 16   SpO2 96%   Patient here Boarding. Awaiting SNF placement . No MRI needed,.  CSW at bedside discussing case.    Arthor Captain, PA-C 07/13/19 1859    Pollyann Savoy, MD 07/14/19 [phone]

## 2019-07-14 NOTE — Progress Notes (Addendum)
2pm: CSW spoke with Lorrie at [facility] who states that [facility] is ready to receive the patient now.  The patient will go to room 129. The number to call for report is [phone]. Patient will be transported via [facility] to [facility] in [place], RN to call once ready.  CSW informed Misty, HCPOA of discharge information.  10:30am: CSW has attempted to [facility] several times without success.  CSW spoke with Pamala Hurry at [facility] who states the facility does not have beds available today. Pamala Hurry reports that the sister facilities [facility] in [place] and [facility] in [place] do have beds available.  CSW spoke with patient and Misty to determine if all were agreeable to discharge to [facility] today with the plan to go to [facility] on Tuesday once a bed is available there. Misty and patient were both agreeable to that plan.   8:40am: CSW met with patient to present her with the bed offer from [facility] in [place] - patient attempted to reach [place], HCPOA to include her in conversation but no answer. Patient left a voicemail requesting a return call. Patient provided CSW with permission to contact [facility].  CSW spoke with [facility] who is agreeable for the patient to discharge to the facility. Misty requested CSW initiate with facility to get the patient discharged as soon as possible.  Madilyn Fireman, MSW, LCSW-A Transitions of Care  Clinical Social Worker  [facility] Emergency Departments  Medical ICU [phone]

## 2019-07-14 NOTE — Progress Notes (Signed)
  4:25 CSW received call requesting that AVS for Pt be faxed to [facility]. Fax sent/acknowledged received.  3:15CSW received call from [facility] Nurse Liaison, for assistance in coordination of discharge.

## 2019-07-14 NOTE — ED Notes (Signed)
PT[facility] called for transport.  

## 2019-07-14 NOTE — ED Notes (Addendum)
Attempted report to [facility]. Unable to speak to someone, will try again later

## 2019-07-14 NOTE — ED Notes (Signed)
AM PT/OT

## 2019-07-14 NOTE — ED Notes (Signed)
Patient asked to use the bath room and to wash up; Staff attempted to have patient do peri care while in the room using the bedside due to prior episode of not being able to get off the toilet; Staff also offered for patient to put on a gown to get out of clothing she has worn for past 3 days; pt declined offers by staff to perform patient care in a safe manner; RN explained reasoning's for not wanting to try wheeling her in the bathroom in recliner again;  Patient was very frustrated with NT who was concern about patient falling and not being able to get off toilet; RN used a NT from different zone to assist patient to the bathroom in recliner; Patient was able to use railings in bathroom to pull self up to get in and out of recliner with minimal assistance from RN or NT present; Patient able to perform peri care on self; Pt transported back to room where she continues to sleep in recliner for comfort; all other safe measures has been offered to patient for toileting and patient understands safety concerns; RN has spoke to patient at length regarding safety concerns and high fall risk concerns by staff-Monique,RN

## 2019-07-14 NOTE — ED Notes (Signed)
Lunch Tray Ordered @ 1049. °

## 2019-07-14 NOTE — ED Provider Notes (Signed)
Emergency Medicine Observation Re-evaluation Note  Angela Dudley is a 74 y.o. female, seen on rounds today.  Pt initially presented to the ED for complaints of Knee Pain Currently, the patient is sitting in recliner chair watching television  Physical Exam  BP 104/66 (BP Location: Right Arm)   Pulse 83   Temp 97.9 F (36.6 C)   Resp 17   SpO2 94%    Physical Exam  PE: Constitutional: well-developed, well-nourished, no apparent distress HENT: normocephalic, atraumatic. no cervical adenopathy Cardiovascular: normal rate and rhythm, distal pulses intact Pulmonary/Chest: effort normal; breath sounds clear and equal bilaterally; no wheezes or rales Abdominal: soft and nontender Musculoskeletal: full ROM, no edema Neurological: alert with goal directed thinking Skin: warm and dry, no rash, no diaphoresis Psychiatric: normal mood and affect, normal behavior    ED Course / MDM  EKG:    I have reviewed the labs performed to date as well as medications administered while in observation.  Recent changes in the last 24 hours include acepted at a SNF.  Plan  Current plan is for patient to be discharged from ED with placement at [facility] in [place]. POA has been informed of plan by CSW and is in agreement. Patient stable at time of disposition.   Portions of this note were generated with Scientist, clinical (histocompatibility and immunogenetics). Dictation errors may occur despite best attempts at proofreading.    Kathyrn Lass 07/14/19 1204    Bethann Berkshire, MD 07/17/19 1007

## 2019-07-27 ENCOUNTER — Other Ambulatory Visit: Payer: Self-pay | Admitting: Neurology

## 2019-07-27 DIAGNOSIS — R55 Syncope and collapse: Secondary | ICD-10-CM

## 2019-07-27 DIAGNOSIS — H814 Vertigo of central origin: Secondary | ICD-10-CM

## 2019-07-31 ENCOUNTER — Telehealth: Payer: Self-pay

## 2019-07-31 NOTE — Telephone Encounter (Signed)
-----   Message from Glean Salvo, NP sent at 07/31/2019  2:01 PM EDT ----- Cardiac monitor was unremarkable.    Normal sinus rhythm  Rare PVC's  No atrial fibrillation, excessive bradycardia or ventricular arrhythmia.

## 2019-07-31 NOTE — Telephone Encounter (Signed)
Error

## 2019-07-31 NOTE — Telephone Encounter (Signed)
Attempted to call the patient without success. ° °LM on the VM for the patient to call back re: recent results. ° °**If the patient calls back please relay the message below from Sarah Slack NP °

## 2019-08-08 ENCOUNTER — Ambulatory Visit (INDEPENDENT_AMBULATORY_CARE_PROVIDER_SITE_OTHER): Payer: Medicare Other | Admitting: Neurology

## 2019-08-08 ENCOUNTER — Encounter: Payer: Self-pay | Admitting: Neurology

## 2019-08-08 VITALS — BP 127/74 | HR 92 | Ht 62.0 in | Wt 230.0 lb

## 2019-08-08 DIAGNOSIS — R2681 Unsteadiness on feet: Secondary | ICD-10-CM | POA: Diagnosis not present

## 2019-08-08 DIAGNOSIS — R55 Syncope and collapse: Secondary | ICD-10-CM

## 2019-08-08 MED ORDER — GABAPENTIN 100 MG PO CAPS: 200.0000 mg | ORAL_CAPSULE | Freq: Two times a day (BID) | ORAL | 3 refills | Status: DC

## 2019-08-08 NOTE — Progress Notes (Signed)
Reason for visit: Cervicogenic headache, gait disorder, left knee pain  Angela Dudley is an 74 y.o. female  History of present illness:  Angela Dudley is a 74 year old left-handed white female with a history of chronic neck pain and cervicogenic headache, morbid obesity, severe degenerative arthritis affecting the left knee. The patient was in the emergency room on 12 July 2019 when she injured her left knee, she went to a rehab facility for several weeks following this and is now back home. MRI of the left knee has shown multiple issues including a Baker's cyst, subchondral fractures, and meniscal tears. The patient has a knee brace on, she can walk only a few steps. She will be following up with orthopedic surgery in the near future. She has not had any episodes of lightheadedness or syncope recently, a cardiac monitor study was unremarkable. MRI of the brain was ordered but not yet done as the patient was in a rehab facility. She has cut back on her gabapentin and begun having increased tingling in her feet. The patient returns for an evaluation.  Past Medical History:  Diagnosis Date  . Abdominal pain, generalized   . Allergy   . Anemia   . Anxiety    pt denies  . Asthma   . Back pain   . Cervicogenic headache 06/08/2016  . Chronic fatigue syndrome   . Cramps, muscle, general   . Degenerative disc disease   . Diverticulosis of colon (without mention of hemorrhage)   . Dizziness   . Esophageal hernia   . Fever   . Fibromyalgia   . Frequent urination at night   . GERD (gastroesophageal reflux disease)    under control  . H/O hiatal hernia   . Headache(784.0)   . Hemorrhoids   . Hyperlipidemia   . Hypothyroidism    no medication- endrocnologist said she never had it  . IBS (irritable bowel syndrome)   . Idiopathic peripheral autonomic neuropathy   . Left carpal tunnel syndrome   . Leg fracture 07/2019  . Numbness and tingling    hands, feet, & lips  . Obesity   . Obesity    . Orthostatic hypotension    with prev tilt table test- 08/18/16- "have not passed out in years."  . Orthostatic hypotension   . Osteoarthritis   . Other B-complex deficiencies   . Peptic ulcer disease   . Peritoneal adhesions (postoperative) (postinfection)   . Pre-diabetes   . Sarcoidosis   . Sarcoidosis   . Shortness of breath    exertion- due to chronic fatigue syndrome, sarcoid  . Stenosing tenosynovitis    left wrist  . Thyroid disease    Unspecified hypothyroidism  . Unspecified intestinal obstruction   . UTI (lower urinary tract infection)    hx of  . Vitreous floaters, left   . Wart viral     Past Surgical History:  Procedure Laterality Date  . ANTERIOR CERVICAL DECOMP/DISCECTOMY FUSION N/A 10/16/2013   Procedure: Cervical three-four, Cervical five-six Anterior cervical decompression/diskectomy/fusion;  Surgeon: Barnett Abu, MD;  Location: [facility];  Service: Neurosurgery;  Laterality: N/A;  Cervical three-four, Cervical five-six Anterior cervical decompression/diskectomy/fusion  . ANTERIOR CERVICAL DECOMP/DISCECTOMY FUSION N/A 07/23/2014   Procedure: Cervical four-five Anterior cervical decompression/diskectomy/fusion;  Surgeon: Barnett Abu, MD;  Location: [facility];  Service: Neurosurgery;  Laterality: N/A;  C4-5 Anterior cervical decompression/diskectomy/fusion  . APPENDECTOMY    . BACK SURGERY  2015   herniated disc - cervical  .  BELPHAROPTOSIS REPAIR Bilateral   . BIOPSY THYROID     benign  . CARPAL TUNNEL RELEASE Bilateral   . CATARACT EXTRACTION Bilateral 1998  . CHOLECYSTECTOMY  2006  . COLON SURGERY  1996   partial colon removal  . COLONOSCOPY W/ POLYPECTOMY    . DILATION AND CURETTAGE OF UTERUS    . Doppler arterial studies  05/13/2008   Both carotid systems demonstrate minimal plaque formation with normal flow velocities throughout.  Vertebrals demonstrate antegrade flow bilaterally.  . DOPPLER ECHOCARDIOGRAPHY  2010   Diastolic relaxation  abnormality, mild AVSC, mild MR/TR and mild to mod AR  . ESOPHAGOGASTRODUODENOSCOPY    . HERNIA REPAIR  2005   [facility]ral  . LASIK  1998   Cataract  . LUNG BIOPSY    . LYMPH NODE DISSECTION     sternum  . MASS EXCISION Right 10/19/2016   Procedure: EXCISION DUBUYTRENS NODULE RIGHT MIDDLE FINGER;  Surgeon: Dairl Ponder, MD;  Location: [facility] OR;  Service: Orthopedics;  Laterality: Right;  . NASAL SEPTUM SURGERY  2006  . NOSE SURGERY  2006  . Nuclear Test  08/2005   Normal (chest pain)  . PARS PLANA VITRECTOMY Left 08/20/2016  . PARS PLANA VITRECTOMY Left 08/20/2016   Procedure: PARS PLANA VITRECTOMY WITH 25 GAUGE;  Surgeon: Edmon Crape, MD;  Location: [facility] OR;  Service: Ophthalmology;  Laterality: Left;  . PARS PLANA VITRECTOMY Right 11/30/2017   Procedure: PARS PLANA VITRECTOMY WITH 25 GAUGE;  Surgeon: Edmon Crape, MD;  Location: [facility] OR;  Service: Ophthalmology;  Laterality: Right;  . PARTIAL COLECTOMY  1996   complicated by an abscess formation  . PHOTOCOAGULATION WITH LASER Right 11/30/2017   Procedure: PHOTOCOAGULATION WITH LASER;  Surgeon: Edmon Crape, MD;  Location: [facility] OR;  Service: Ophthalmology;  Laterality: Right;  . Right hip bursa removal  1991  . SBO  2002  . TENOLYSIS Right 10/19/2016   Procedure: RIGHT WRIST STENOSING TENOSYNOVITIS RELEASE ;  Surgeon: Dairl Ponder, MD;  Location: [facility] OR;  Service: Orthopedics;  Laterality: Right;  . TONSILLECTOMY    . TRIGGER FINGER RELEASE Right 10/19/2016   Procedure: RIGHT THUMB RELEASE TRIGGER FINGER/A-1 PULLEY;  Surgeon: Dairl Ponder, MD;  Location: [facility] OR;  Service: Orthopedics;  Laterality: Right;    Family History  Problem Relation Age of Onset  . Crohn's disease Mother   . Heart disease Father   . Hyperlipidemia Neg Hx        No history of early CAD  . Cancer Neg Hx        No history of lymphoma or leukemia    Social history:  reports that she has never smoked. She has never used smokeless tobacco. She  reports that she does not drink alcohol and does not use drugs.    Allergies  Allergen Reactions  . Amoxicillin Rash and Other (See Comments)    Did it involve swelling of the face/tongue/throat, SOB, or low BP? No Did it involve sudden or severe rash/hives, skin peeling, or any reaction on the inside of your mouth or nose? No Did you need to seek medical attention at a hospital or doctor's office? No When did it last happen?35 years If all above answers are "NO", may proceed with cephalosporin use.    . Other Other (See Comments)    Hepatotoxic Gases per MD request this is not to be used due to Chronic Fatigue Syndrome creates risk for Hepatitis.   . Decadron [Dexamethasone] Other (See Comments)  Breathing problems. Pt reports that all steroids cause her to retain fluid.  . Esomeprazole Magnesium Other (See Comments)     Esophageal spasm  . Percocet [Oxycodone-Acetaminophen] Other (See Comments)    It "wires me" , I can't sleep    Medications:  Prior to Admission medications   Medication Sig Start Date End Date Taking? Authorizing Provider  acetaminophen (TYLENOL) 500 MG tablet Take 1,000 mg by mouth every 6 (six) hours as needed for moderate pain or headache.   Yes [provider]  B Complex-C-Folic Acid (STRESS B COMPLEX PO) Take 1 tablet by mouth daily.   Yes [provider]  Biotin 10 MG CAPS Take 10 mg by mouth daily.    Yes [provider]  Black Elderberry (SAMBUCUS ELDERBERRY PO) Take 1,250 mg by mouth daily.   Yes [provider]  cetirizine (ZYRTEC) 10 MG tablet Take 10 mg by mouth at bedtime.    Yes [provider]  Chromium Picolinate 500 MCG CAPS Take 500 mcg by mouth daily.   Yes [provider]  Coenzyme Q10 (COQ-10 PO) Take 120 mg by mouth daily.   Yes [provider]  ECHINACEA PO Take 1 capsule by mouth daily.   Yes [provider]  gabapentin (NEURONTIN) 100 MG capsule Take 2 capsules  (200 mg total) by mouth 2 (two) times daily. Patient taking differently: Take 100 mg by mouth daily.  06/13/19  Yes York Spaniel, MD  Ginger, Zingiber officinalis, (GINGER ROOT) 550 MG CAPS Take 1,100 mg by mouth 3 (three) times daily.    Yes [provider]  Glucosamine-Chondroitin (GLUCOSAMINE CHONDR COMPLEX PO) Take 1 capsule by mouth daily.   Yes [provider]  LECITHIN PO Take 2-4 tablets by mouth See admin instructions. Take 4 tablets at night, may take a 2 to 4 tablets as needed to continue sleep if woken up   Yes [provider]  Magnesium 500 MG TABS Take 1,000 mg by mouth at bedtime.    Yes [provider]  Omega-3 Fatty Acids (OMEGA 3 PO) Take 1,360 mg by mouth daily.   Yes [provider]  Polyethyl Glycol-Propyl Glycol (SYSTANE) 0.4-0.3 % GEL ophthalmic gel Place 1 application into both eyes 2 (two) times daily as needed (dry eyes).   Yes [provider]  Potassium 99 MG TABS Take 495 mg by mouth at bedtime as needed (muscle cramps).    Yes [provider]  potassium chloride SA (K-DUR,KLOR-CON) 20 MEQ tablet Take 1 tablet (20 mEq total) by mouth daily. Patient taking differently: Take 10 mEq by mouth at bedtime.    Yes Joaquim Nam, MD  rOPINIRole (REQUIP) 0.25 MG tablet Take 0.25 mg by mouth at bedtime.   Yes [provider]  Selenium 200 MCG CAPS Take 200 mcg by mouth daily.    Yes [provider]  spironolactone (ALDACTONE) 50 MG tablet Take 50 mg by mouth daily in the afternoon.   Yes [provider]  traZODone (DESYREL) 50 MG tablet Take 50 mg by mouth at bedtime.  04/24/19  Yes [provider]  TURMERIC PO Take 800 mg by mouth 3 (three) times daily.   Yes [provider]  Zinc 50 MG TABS Take 50 mg by mouth daily.    Yes [provider]    ROS:  Out of a complete 14 system review of symptoms, the patient complains only of the following symptoms, and all  other reviewed systems  are negative.  Feet tingling Left knee pain Walking difficulty  Blood pressure 127/74, pulse 92, height 5\' 2"  (1.575 m), weight 230 lb (104.3 kg).  Physical Exam  General: The patient is alert and cooperative at the time of the examination. The patient is morbidly obese.  Skin: 2-3+ edema below the knees is seen bilaterally.   Neurologic Exam  Mental status: The patient is alert and oriented x 3 at the time of the examination. The patient has apparent normal recent and remote memory, with an apparently normal attention span and concentration ability.   Cranial nerves: Facial symmetry is present. Speech is normal, no aphasia or dysarthria is noted. Extraocular movements are full. Visual fields are full.  Motor: The patient has good strength in all 4 extremities.  Sensory examination: Soft touch sensation is symmetric on the face, arms, and legs.  Coordination: The patient has good finger-nose-finger and heel-to-shin bilaterally.  Gait and station: The patient can walk only a few steps, the gait is wide-based.  Reflexes: Deep tendon reflexes are symmetric.   MRI left knee 08/03/19:  IMPRESSION:  1. Small subchondral fracture of the lateral femoral condyle.  2. Subtle subchondral fracture of the lateral tibial plateau.  3. Complex tear and extrusion of the lateral meniscus.  4. Horizontal tear of the medial meniscus.  5. Reactive changes/grade 2 sprain of the medial collateral ligament.  6. Mucoid degeneration and possible low-grade partial tear of the ACL.  7. Moderate knee joint effusion with synovitis.  8. Moderate leaking Baker cyst.    30 day cardiac monitor:  Normal sinus rhythm            Rare PVC's            No atrial fibrillation, excessive bradycardia or ventricular arrhythmia.     Assessment/Plan:  1. Morbid obesity  2. Cervicogenic headache  3. Left knee pain  The patient will go back on her usual dose of  gabapentin taking 200 mg twice daily, prescription was sent in. She will be following up with orthopedic surgeon in the near future. She will follow-up here in about 6 months.  Marlan Palau. Keith Gaylon Melchor MD 08/08/2019 11:40 AM  [facility] [address] [place], [place] [id number]  Phone [phone] Fax [phone]

## 2019-09-21 ENCOUNTER — Ambulatory Visit: Payer: Medicare Other | Admitting: Neurology

## 2019-11-09 ENCOUNTER — Other Ambulatory Visit (HOSPITAL_COMMUNITY): Payer: 59

## 2019-11-20 ENCOUNTER — Encounter (HOSPITAL_COMMUNITY): Admission: RE | Payer: Self-pay | Source: Home / Self Care

## 2019-11-20 ENCOUNTER — Ambulatory Visit (HOSPITAL_COMMUNITY): Admission: RE | Admit: 2019-11-20 | Payer: Medicare Other | Source: Home / Self Care | Admitting: Orthopedic Surgery

## 2019-11-20 SURGERY — ARTHROSCOPY, KNEE, WITH MEDIAL MENISCECTOMY
Anesthesia: Choice | Site: Knee | Laterality: Left

## 2019-12-18 ENCOUNTER — Telehealth: Payer: Self-pay | Admitting: Neurology

## 2019-12-18 NOTE — Telephone Encounter (Addendum)
Pt called again wanting to know when she will be called back. Pt states she has to leave the house at 3pm and she is wanting to know if she can be called before that. Pt is wanting to know if she is able to take Imitrex for her headaches. Please advise.

## 2019-12-18 NOTE — Telephone Encounter (Signed)
Pt asking for something to be called in for her migraine headache that she has had for 1-2 months, pt states it is severe.

## 2019-12-19 MED ORDER — SUMATRIPTAN SUCCINATE 25 MG PO TABS
25.0000 mg | ORAL_TABLET | ORAL | 3 refills | Status: DC | PRN
Start: 2019-12-19 — End: 2019-12-19

## 2019-12-19 MED ORDER — SUMATRIPTAN SUCCINATE 25 MG PO TABS: 25.0000 mg | ORAL_TABLET | ORAL | 3 refills | Status: DC | PRN

## 2019-12-19 NOTE — Telephone Encounter (Signed)
Called patient and she stated she has never tried Imitrex and would like to try it.  She spoke to the pharmacist who suggested Imitrex.  She stated the headache has been ongoing for the last couple months and she is having trouble getting up and going during the day because of it.

## 2019-12-19 NOTE — Telephone Encounter (Signed)
I have reviewed MRI of brain, age related changes, I see no contra-inidcation for triptan,   Meds ordered this encounter  Medications  . SUMAtriptan (IMITREX) 25 MG tablet    Sig: Take 1 tablet (25 mg total) by mouth every 2 (two) hours as needed for migraine. May repeat in 2 hours if headache persists or recurs.    Dispense:  12 tablet    Refill:  3    Do not refill in less than 30 days.    Hard copy was printed out

## 2019-12-19 NOTE — Addendum Note (Signed)
Addended by: Alexia Freestone R on: 12/19/2019 03:24 PM   Modules accepted: Orders

## 2019-12-19 NOTE — Addendum Note (Signed)
Addended by: Levert Feinstein on: 12/19/2019 02:02 PM   Modules accepted: Orders

## 2019-12-25 ENCOUNTER — Telehealth: Payer: Self-pay | Admitting: Neurology

## 2019-12-25 NOTE — Telephone Encounter (Signed)
I called the patient.  The patient is had 2 episodes briefly associated with visual changes associated with with a sensation of looking from inside of the tunnel, and looking far away.  The patient sat down quickly.  I suspect this may have represented a drop in blood pressure.  Both episodes occurred while standing while cooking.  The patient reported no chest pain or palpitations of the heart or diaphoresis.  She has had a stroke evaluation and a cardiac monitor study before that were unremarkable.  She continues to have daily cervicogenic headache.  She has not been able to tolerate doses of gabapentin higher than 200 mg.

## 2020-01-09 ENCOUNTER — Encounter (INDEPENDENT_AMBULATORY_CARE_PROVIDER_SITE_OTHER): Payer: Medicare Other | Admitting: Ophthalmology

## 2020-02-08 ENCOUNTER — Encounter: Payer: Self-pay | Admitting: Neurology

## 2020-02-08 ENCOUNTER — Ambulatory Visit (INDEPENDENT_AMBULATORY_CARE_PROVIDER_SITE_OTHER): Payer: Medicare Other | Admitting: Neurology

## 2020-02-08 ENCOUNTER — Other Ambulatory Visit: Payer: Self-pay

## 2020-02-08 VITALS — BP 113/68 | HR 88 | Ht 62.0 in | Wt 222.0 lb

## 2020-02-08 DIAGNOSIS — G4486 Cervicogenic headache: Secondary | ICD-10-CM | POA: Diagnosis not present

## 2020-02-08 DIAGNOSIS — R2681 Unsteadiness on feet: Secondary | ICD-10-CM

## 2020-02-08 DIAGNOSIS — G2581 Restless legs syndrome: Secondary | ICD-10-CM

## 2020-02-08 DIAGNOSIS — R55 Syncope and collapse: Secondary | ICD-10-CM

## 2020-02-08 MED ORDER — ROPINIROLE HCL 0.5 MG PO TABS: 0.5000 mg | ORAL_TABLET | Freq: Two times a day (BID) | ORAL | 1 refills | Status: DC

## 2020-02-08 NOTE — Patient Instructions (Signed)
May increase the requip to 0.5 mg in the late afternoon and in the evening.

## 2020-02-08 NOTE — Progress Notes (Signed)
Reason for visit: Headache, neck pain, restless leg syndrome, walking difficulty  Angela Dudley is an 75 y.o. female  History of present illness:  Angela Dudley is a 75 year old left-handed white female with a history of morbid obesity and a history of degenerative arthritis.  She just recently underwent surgery for a total knee replacement on the left.  This was done on 12 January 2020, she is getting physical therapy for currently.  She has had episodes of drops in blood pressure and near syncope since 1983, she has had several recent events where she will get tunnel vision, she feels better when she is sits down.  The patient has not blacked out or fallen.  She is now walking with a walker but hopes to transition to a cane in the near future.  She has restless leg syndrome, she takes only 0.25 mg of Requip at night, her restless legs usually starts around 5 PM.  The patient has occasional headaches in the back of her head going to the left periorbital area.  She has had these headaches for several years.  She returns for further evaluation.  She remains on gabapentin 200 mg at night.  Past Medical History:  Diagnosis Date  . Abdominal pain, generalized   . Allergy   . Anemia   . Anxiety    pt denies  . Asthma   . Back pain   . Cervicogenic headache 06/08/2016  . Chronic fatigue syndrome   . Cramps, muscle, general   . Degenerative disc disease   . Diverticulosis of colon (without mention of hemorrhage)   . Dizziness   . Esophageal hernia   . Fever   . Fibromyalgia   . Frequent urination at night   . GERD (gastroesophageal reflux disease)    under control  . H/O hiatal hernia   . Headache(784.0)   . Hemorrhoids   . Hyperlipidemia   . Hypothyroidism    no medication- endrocnologist said she never had it  . IBS (irritable bowel syndrome)   . Idiopathic peripheral autonomic neuropathy   . Left carpal tunnel syndrome   . Leg fracture 07/2019  . Numbness and tingling    hands,  feet, & lips  . Obesity   . Obesity   . Orthostatic hypotension    with prev tilt table test- 08/18/16- "have not passed out in years."  . Orthostatic hypotension   . Osteoarthritis   . Other B-complex deficiencies   . Peptic ulcer disease   . Peritoneal adhesions (postoperative) (postinfection)   . Pre-diabetes   . Sarcoidosis   . Sarcoidosis   . Shortness of breath    exertion- due to chronic fatigue syndrome, sarcoid  . Stenosing tenosynovitis    left wrist  . Thyroid disease    Unspecified hypothyroidism  . Unspecified intestinal obstruction   . UTI (lower urinary tract infection)    hx of  . Vitreous floaters, left   . Wart viral     Past Surgical History:  Procedure Laterality Date  . ANTERIOR CERVICAL DECOMP/DISCECTOMY FUSION N/A 10/16/2013   Procedure: Cervical three-four, Cervical five-six Anterior cervical decompression/diskectomy/fusion;  Surgeon: Barnett Abu, MD;  Location: [facility];  Service: Neurosurgery;  Laterality: N/A;  Cervical three-four, Cervical five-six Anterior cervical decompression/diskectomy/fusion  . ANTERIOR CERVICAL DECOMP/DISCECTOMY FUSION N/A 07/23/2014   Procedure: Cervical four-five Anterior cervical decompression/diskectomy/fusion;  Surgeon: Barnett Abu, MD;  Location: [facility];  Service: Neurosurgery;  Laterality: N/A;  C4-5 Anterior cervical decompression/diskectomy/fusion  .  APPENDECTOMY    . BACK SURGERY  2015   herniated disc - cervical  . BELPHAROPTOSIS REPAIR Bilateral   . BIOPSY THYROID     benign  . CARPAL TUNNEL RELEASE Bilateral   . CATARACT EXTRACTION Bilateral 1998  . CHOLECYSTECTOMY  2006  . COLON SURGERY  1996   partial colon removal  . COLONOSCOPY W/ POLYPECTOMY    . DILATION AND CURETTAGE OF UTERUS    . Doppler arterial studies  05/13/2008   Both carotid systems demonstrate minimal plaque formation with normal flow velocities throughout.  Vertebrals demonstrate antegrade flow bilaterally.  . DOPPLER  ECHOCARDIOGRAPHY  2010   Diastolic relaxation abnormality, mild AVSC, mild MR/TR and mild to mod AR  . ESOPHAGOGASTRODUODENOSCOPY    . HERNIA REPAIR  2005   [facility]  . LASIK  1998   Cataract  . LUNG BIOPSY    . LYMPH NODE DISSECTION     sternum  . MASS EXCISION Right 10/19/2016   Procedure: EXCISION DUBUYTRENS NODULE RIGHT MIDDLE FINGER;  Surgeon: Dairl Ponder, MD;  Location: [facility] OR;  Service: Orthopedics;  Laterality: Right;  . NASAL SEPTUM SURGERY  2006  . NOSE SURGERY  2006  . Nuclear Test  08/2005   Normal (chest pain)  . PARS PLANA VITRECTOMY Left 08/20/2016  . PARS PLANA VITRECTOMY Left 08/20/2016   Procedure: PARS PLANA VITRECTOMY WITH 25 GAUGE;  Surgeon: Edmon Crape, MD;  Location: [facility] OR;  Service: Ophthalmology;  Laterality: Left;  . PARS PLANA VITRECTOMY Right 11/30/2017   Procedure: PARS PLANA VITRECTOMY WITH 25 GAUGE;  Surgeon: Edmon Crape, MD;  Location: [facility] OR;  Service: Ophthalmology;  Laterality: Right;  . PARTIAL COLECTOMY  1996   complicated by an abscess formation  . PHOTOCOAGULATION WITH LASER Right 11/30/2017   Procedure: PHOTOCOAGULATION WITH LASER;  Surgeon: Edmon Crape, MD;  Location: [facility] OR;  Service: Ophthalmology;  Laterality: Right;  . Right hip bursa removal  1991  . SBO  2002  . TENOLYSIS Right 10/19/2016   Procedure: RIGHT WRIST STENOSING TENOSYNOVITIS RELEASE ;  Surgeon: Dairl Ponder, MD;  Location[facility] OR;  Service: Orthopedics;  Laterality: Right;  . TONSILLECTOMY    . TRIGGER FINGER RELEASE Right 10/19/2016   Procedure: RIGHT THUMB RELEASE TRIGGER FINGER/A-1 PULLEY;  Surgeon: Dairl Ponder, MD;  Location: [facility] OR;  Service: Orthopedics;  Laterality: Right;    Family History  Problem Relation Age of Onset  . Crohn's disease Mother   . Heart disease Father   . Hyperlipidemia Neg Hx        No history of early CAD  . Cancer Neg Hx        No history of lymphoma or leukemia    Social history:  reports that she has never smoked.  She has never used smokeless tobacco. She reports that she does not drink alcohol and does not use drugs.    Allergies  Allergen Reactions  . Amoxicillin Rash and Other (See Comments)    Did it involve swelling of the face/tongue/throat, SOB, or low BP? No Did it involve sudden or severe rash/hives, skin peeling, or any reaction on the inside of your mouth or nose? No Did you need to seek medical attention at a hospital or doctor's office? No When did it last happen?35 years If all above answers are "NO", may proceed with cephalosporin use.    . Other Other (See Comments)    Hepatotoxic Gases per MD request this is not to be used  due to Chronic Fatigue Syndrome creates risk for Hepatitis.   . Decadron [Dexamethasone] Other (See Comments)    Breathing problems. Pt reports that all steroids cause her to retain fluid.  . Esomeprazole Magnesium Other (See Comments)     Esophageal spasm  . Percocet [Oxycodone-Acetaminophen] Other (See Comments)    It "wires me" , I can't sleep    Medications:  Prior to Admission medications   Medication Sig Start Date End Date Taking? Authorizing Provider  acetaminophen (TYLENOL) 500 MG tablet Take 1,000 mg by mouth every 6 (six) hours as needed for moderate pain or headache.   Yes [provider]  B Complex-C-Folic Acid (STRESS B COMPLEX PO) Take 1 tablet by mouth daily.   Yes [provider]  Biotin 10 MG CAPS Take 10 mg by mouth daily.    Yes [provider]  Black Elderberry (SAMBUCUS ELDERBERRY PO) Take 1,250 mg by mouth daily.   Yes [provider]  cetirizine (ZYRTEC) 10 MG tablet Take 10 mg by mouth at bedtime.    Yes [provider]  Chromium Picolinate 500 MCG CAPS Take 500 mcg by mouth daily.   Yes [provider]  Coenzyme Q10 (COQ-10 PO) Take 120 mg by mouth daily.   Yes [provider]  ECHINACEA PO Take 1 capsule by mouth daily.   Yes [provider]  gabapentin  (NEURONTIN) 100 MG capsule Take 2 capsules (200 mg total) by mouth 2 (two) times daily. 08/08/19  Yes York Spaniel, MD  Ginger, Zingiber officinalis, (GINGER ROOT) 550 MG CAPS Take 1,100 mg by mouth 3 (three) times daily.    Yes [provider]  Glucosamine-Chondroitin (GLUCOSAMINE CHONDR COMPLEX PO) Take 1 capsule by mouth daily.   Yes [provider]  LECITHIN PO Take 2-4 tablets by mouth See admin instructions. Take 4 tablets at night, may take a 2 to 4 tablets as needed to continue sleep if woken up   Yes [provider]  Magnesium 500 MG TABS Take 1,000 mg by mouth at bedtime.    Yes [provider]  Polyethyl Glycol-Propyl Glycol (SYSTANE) 0.4-0.3 % GEL ophthalmic gel Place 1 application into both eyes 2 (two) times daily as needed (dry eyes).   Yes [provider]  Potassium 99 MG TABS Take 495 mg by mouth at bedtime as needed (muscle cramps).    Yes [provider]  potassium chloride SA (K-DUR,KLOR-CON) 20 MEQ tablet Take 1 tablet (20 mEq total) by mouth daily. Patient taking differently: Take 10 mEq by mouth at bedtime.   Yes Joaquim Nam, MD  rOPINIRole (REQUIP) 0.25 MG tablet Take 0.25 mg by mouth at bedtime.   Yes [provider]  Selenium 200 MCG CAPS Take 200 mcg by mouth daily.   Yes [provider]  spironolactone (ALDACTONE) 50 MG tablet Take 50 mg by mouth daily in the afternoon.   Yes [provider]  SUMAtriptan (IMITREX) 25 MG tablet Take 1 tablet (25 mg total) by mouth every 2 (two) hours as needed for migraine. May repeat in 2 hours if headache persists or recurs. 12/19/19  Yes Levert Feinstein, MD  torsemide (DEMADEX) 20 MG tablet Take 20 mg by mouth daily.   Yes [provider]  traZODone (DESYREL) 50 MG tablet Take 50 mg by mouth at bedtime.  04/24/19  Yes [provider]  TURMERIC PO Take 800 mg by mouth 3 (three) times daily.   Yes [provider]  Zinc 50 MG TABS  Take 50 mg by mouth daily.    Yes [provider]  Omega-3 Fatty Acids (OMEGA 3 PO) Take 1,360 mg by mouth daily.    [provider]    ROS:  Out of a complete 14 system review of symptoms, the patient complains only of the following symptoms, and all other reviewed systems are negative.  Walking difficulty Leg swelling Headache Restless legs  Blood pressure 113/68, pulse 88, height 5\' 2"  (1.575 m), weight 222 lb (100.7 kg).  Physical Exam  General: The patient is alert and cooperative at the time of the examination.  The patient is markedly obese.  Skin: The patient has swelling around the left knee including the thigh and below the knee.  1+ edema on the right leg below the knee is seen.   Neurologic Exam  Mental status: The patient is alert and oriented x 3 at the time of the examination. The patient has apparent normal recent and remote memory, with an apparently normal attention span and concentration ability.   Cranial nerves: Facial symmetry is present. Speech is normal, no aphasia or dysarthria is noted. Extraocular movements are full. Visual fields are full.  Motor: The patient has good strength in all 4 extremities.  Sensory examination: Soft touch sensation is symmetric on the face, arms, and legs.  Coordination: The patient has good finger-nose-finger and heel-to-shin bilaterally.  Gait and station: The patient walks with a walker, she has slow deliberate steps with this.  Tandem gait was not attempted.  Reflexes: Deep tendon reflexes are symmetric, but are depressed.   Assessment/Plan:  1.  Cervicogenic headache  2.  Restless leg syndrome  3.  Gait disorder  4.  Recent left knee surgery  The patient will be increased on the Requip taking 0.5 milligrams tablets at 4 PM and at bedtime.  The patient will continue her current dose of gabapentin.  She will follow up here in 6 months.  MD 02/08/2020 1:22 PM  [facility] [address] [place], [place]  Phone [phone] Fax [phone]

## 2020-03-18 ENCOUNTER — Telehealth: Payer: Self-pay | Admitting: Neurology

## 2020-03-18 MED ORDER — GABAPENTIN 100 MG PO CAPS: 200.0000 mg | ORAL_CAPSULE | Freq: Three times a day (TID) | ORAL | 1 refills | Status: DC

## 2020-03-18 NOTE — Telephone Encounter (Signed)
I called the patient.  The patient is having headaches that do come up from the back of the head and go behind the eyes, left greater than right.  She has some tenderness above the left eyebrow.  She has Imitrex to take but this offered little benefit at 25 mg dose.  We will go up on the gabapentin to 200 mg 3 times daily, may consider addition of Effexor or Topamax in the future.  She will call if the headaches continue.  She claims that the headaches are daily at this point.

## 2020-03-25 ENCOUNTER — Telehealth: Payer: Self-pay | Admitting: Neurology

## 2020-03-25 NOTE — Telephone Encounter (Signed)
Pt. is asking if all possible that she be able to get depacon injection that she & doctor were discussing thru mychart on tomorrow. Please advise as to when she can schedule this.

## 2020-03-26 NOTE — Telephone Encounter (Signed)
Information given to Liann.

## 2020-03-26 NOTE — Telephone Encounter (Signed)
I called the patient.  She is having ongoing headaches.  I will have my nurse contact the infusion nurse and see if she can come in today for a Depacon 1000 mg rapid infusion.

## 2020-03-26 NOTE — Telephone Encounter (Signed)
Pt called, I desperately need depacon injection for my headaches. Had spoken with Dr. Anne Hahn through MyChart last week, I could get depacon injection. Can I come in to get the injection. Would like a call from the nurse.

## 2020-04-01 NOTE — Telephone Encounter (Signed)
I checked with Liane in [facility]. She already has a signed order for Depacon 1gram infusion (signed last week by Margie Ege, NP). The patient is unable to come in today. She will be here tomorrow at noon for her infusion.

## 2020-04-04 ENCOUNTER — Telehealth: Payer: Self-pay | Admitting: Neurology

## 2020-04-04 MED ORDER — VENLAFAXINE HCL ER 37.5 MG PO CP24: ORAL_CAPSULE | ORAL | 1 refills | Status: DC

## 2020-04-04 NOTE — Telephone Encounter (Signed)
I called the patient.  The patient had some concerns with her headaches may be coming from her neck, but the patient really describes headaches are mainly in the front and around the eyes, the headache is not really originating from the occipital area with no significant neck symptoms.  I do not think that these headaches are cervicogenic headache.  The patient will be sent in a prescription for Effexor to take 37.5 mg daily for 5 days and then go to 75 mg daily.

## 2020-04-16 ENCOUNTER — Other Ambulatory Visit: Payer: Self-pay | Admitting: Neurology

## 2020-04-16 MED ORDER — VENLAFAXINE HCL ER 150 MG PO CP24: 150.0000 mg | ORAL_CAPSULE | Freq: Every day | ORAL | 2 refills | Status: DC

## 2020-04-23 ENCOUNTER — Other Ambulatory Visit: Payer: Self-pay | Admitting: Neurology

## 2020-04-23 MED ORDER — TIZANIDINE HCL 2 MG PO TABS: 2.0000 mg | ORAL_TABLET | Freq: Three times a day (TID) | ORAL | 1 refills | Status: DC

## 2020-06-17 ENCOUNTER — Other Ambulatory Visit: Payer: Self-pay | Admitting: Emergency Medicine

## 2020-06-17 ENCOUNTER — Other Ambulatory Visit: Payer: Self-pay | Admitting: Neurology

## 2020-06-17 MED ORDER — TIZANIDINE HCL 2 MG PO TABS: 2.0000 mg | ORAL_TABLET | Freq: Three times a day (TID) | ORAL | 5 refills | Status: DC

## 2020-08-12 ENCOUNTER — Ambulatory Visit (INDEPENDENT_AMBULATORY_CARE_PROVIDER_SITE_OTHER): Payer: Medicare Other | Admitting: Ophthalmology

## 2020-08-12 ENCOUNTER — Other Ambulatory Visit: Payer: Self-pay

## 2020-08-12 ENCOUNTER — Encounter (INDEPENDENT_AMBULATORY_CARE_PROVIDER_SITE_OTHER): Payer: Self-pay | Admitting: Ophthalmology

## 2020-08-12 DIAGNOSIS — H43312 Vitreous membranes and strands, left eye: Secondary | ICD-10-CM | POA: Diagnosis not present

## 2020-08-12 DIAGNOSIS — H43311 Vitreous membranes and strands, right eye: Secondary | ICD-10-CM | POA: Diagnosis not present

## 2020-08-12 DIAGNOSIS — H33321 Round hole, right eye: Secondary | ICD-10-CM

## 2020-08-12 NOTE — Progress Notes (Signed)
08/12/2020     CHIEF COMPLAINT Patient presents for Retina Follow Up (1 year fu OU and OCT (appointment moved)/Pt states VA OU stable since last visit. Pt denies FOL, floaters, or ocular pain OU. /Pt states, " I do still have trouble getting my eyes to focus together."/)   HISTORY OF PRESENT ILLNESS: Angela Dudley is a 75 y.o. female who presents to the clinic today for:   HPI     Retina Follow Up           Diagnosis: Other   Laterality: both eyes   Onset: 1 year ago   Severity: mild   Duration: 1 year   Course: stable   Comments: 1 year fu OU and OCT (appointment moved) Pt states VA OU stable since last visit. Pt denies FOL, floaters, or ocular pain OU.  Pt states, " I do still have trouble getting my eyes to focus together."          Comments   Over 1 year follow-up.  History of bilateral central vitreous membrane strands and symptomatic vitreous floaters hampering visual activities.  History of bilateral vitrectomy sequentially done to improve in functional vision.      Last edited by Edmon Crape, MD on 08/12/2020  2:15 PM.      Referring physician: Regino Bellow, MD [address] [place],  [place] [address]  HISTORICAL INFORMATION:   Selected notes from the MEDICAL RECORD NUMBER       CURRENT MEDICATIONS: Current Outpatient Medications (Ophthalmic Drugs)  Medication Sig   Polyethyl Glycol-Propyl Glycol (SYSTANE) 0.4-0.3 % GEL ophthalmic gel Place 1 application into both eyes 2 (two) times daily as needed (dry eyes).   No current facility-administered medications for this visit. (Ophthalmic Drugs)   Current Outpatient Medications (Other)  Medication Sig   acetaminophen (TYLENOL) 500 MG tablet Take 1,000 mg by mouth every 6 (six) hours as needed for moderate pain or headache.   B Complex-C-Folic Acid (STRESS B COMPLEX PO) Take 1 tablet by mouth daily.   Biotin 10 MG CAPS Take 10 mg by mouth daily.    Black Elderberry (SAMBUCUS ELDERBERRY PO) Take  1,250 mg by mouth daily.   cetirizine (ZYRTEC) 10 MG tablet Take 10 mg by mouth at bedtime.    Chromium Picolinate 500 MCG CAPS Take 500 mcg by mouth daily.   Coenzyme Q10 (COQ-10 PO) Take 120 mg by mouth daily.   ECHINACEA PO Take 1 capsule by mouth daily.   gabapentin (NEURONTIN) 100 MG capsule Take 2 capsules (200 mg total) by mouth in the morning, at noon, and at bedtime.   Ginger, Zingiber officinalis, (GINGER ROOT) 550 MG CAPS Take 1,100 mg by mouth 3 (three) times daily.    Glucosamine-Chondroitin (GLUCOSAMINE CHONDR COMPLEX PO) Take 1 capsule by mouth daily.   LECITHIN PO Take 2-4 tablets by mouth See admin instructions. Take 4 tablets at night, may take a 2 to 4 tablets as needed to continue sleep if woken up   Magnesium 500 MG TABS Take 1,000 mg by mouth at bedtime.    Potassium 99 MG TABS Take 495 mg by mouth at bedtime as needed (muscle cramps).    potassium chloride SA (K-DUR,KLOR-CON) 20 MEQ tablet Take 1 tablet (20 mEq total) by mouth daily. (Patient taking differently: Take 10 mEq by mouth at bedtime.)   rOPINIRole (REQUIP) 0.5 MG tablet Take 1 tablet (0.5 mg total) by mouth in the morning and at bedtime.   Selenium 200  MCG CAPS Take 200 mcg by mouth daily.   spironolactone (ALDACTONE) 50 MG tablet Take 50 mg by mouth daily in the afternoon.   SUMAtriptan (IMITREX) 25 MG tablet Take 1 tablet (25 mg total) by mouth every 2 (two) hours as needed for migraine. May repeat in 2 hours if headache persists or recurs.   tiZANidine (ZANAFLEX) 2 MG tablet Take 1 tablet (2 mg total) by mouth 3 (three) times daily.   torsemide (DEMADEX) 20 MG tablet Take 20 mg by mouth daily.   traZODone (DESYREL) 50 MG tablet Take 50 mg by mouth at bedtime.    TURMERIC PO Take 800 mg by mouth 3 (three) times daily.   venlafaxine XR (EFFEXOR XR) 150 MG 24 hr capsule Take 1 capsule (150 mg total) by mouth daily with breakfast.   Zinc 50 MG TABS Take 50 mg by mouth daily.    No current facility-administered  medications for this visit. (Other)      REVIEW OF SYSTEMS:    ALLERGIES Allergies  Allergen Reactions   Amoxicillin Rash and Other (See Comments)    Did it involve swelling of the face/tongue/throat, SOB, or low BP? No Did it involve sudden or severe rash/hives, skin peeling, or any reaction on the inside of your mouth or nose? No Did you need to seek medical attention at a hospital or doctor's office? No When did it last happen?      35 years If all above answers are "NO", may proceed with cephalosporin use.     Other Other (See Comments)    Hepatotoxic Gases per MD request this is not to be used due to Chronic Fatigue Syndrome creates risk for Hepatitis.    Decadron [Dexamethasone] Other (See Comments)    Breathing problems. Pt reports that all steroids cause her to retain fluid.   Esomeprazole Magnesium Other (See Comments)     Esophageal spasm   Percocet [Oxycodone-Acetaminophen] Other (See Comments)    It "wires me" , I can't sleep    PAST MEDICAL HISTORY Past Medical History:  Diagnosis Date   Abdominal pain, generalized    Allergy    Anemia    Anxiety    pt denies   Asthma    Back pain    Cervicogenic headache 06/08/2016   Chronic fatigue syndrome    Cramps, muscle, general    Degenerative disc disease    Diverticulosis of colon (without mention of hemorrhage)    Dizziness    Esophageal hernia    Fever    Fibromyalgia    Frequent urination at night    GERD (gastroesophageal reflux disease)    under control   H/O hiatal hernia    Headache(784.0)    Hemorrhoids    Hyperlipidemia    Hypothyroidism    no medication- endrocnologist said she never had it   IBS (irritable bowel syndrome)    Idiopathic peripheral autonomic neuropathy    Left carpal tunnel syndrome    Leg fracture 07/2019   Numbness and tingling    hands, feet, & lips   Obesity    Obesity    Orthostatic hypotension    with prev tilt table test- 08/18/16- "have not passed out in years."    Orthostatic hypotension    Osteoarthritis    Other B-complex deficiencies    Peptic ulcer disease    Peritoneal adhesions (postoperative) (postinfection)    Pre-diabetes    Sarcoidosis    Sarcoidosis    Shortness of breath  exertion- due to chronic fatigue syndrome, sarcoid   Stenosing tenosynovitis    left wrist   Thyroid disease    Unspecified hypothyroidism   Unspecified intestinal obstruction    UTI (lower urinary tract infection)    hx of   Vitreous floaters, left    Wart viral    Past Surgical History:  Procedure Laterality Date   ANTERIOR CERVICAL DECOMP/DISCECTOMY FUSION N/A 10/16/2013   Procedure: Cervical three-four, Cervical five-six Anterior cervical decompression/diskectomy/fusion;  Surgeon: Barnett AbuHenry Elsner, MD;  Location: [facility];  Service: Neurosurgery;  Laterality: N/A;  Cervical three-four, Cervical five-six Anterior cervical decompression/diskectomy/fusion   ANTERIOR CERVICAL DECOMP/DISCECTOMY FUSION N/A 07/23/2014   Procedure: Cervical four-five Anterior cervical decompression/diskectomy/fusion;  Surgeon: Barnett AbuHenry Elsner, MD;  Location: [facility];  Service: Neurosurgery;  Laterality: N/A;  C4-5 Anterior cervical decompression/diskectomy/fusion   APPENDECTOMY     BACK SURGERY  2015   herniated disc - cervical   BELPHAROPTOSIS REPAIR Bilateral    BIOPSY THYROID     benign   CARPAL TUNNEL RELEASE Bilateral    CATARACT EXTRACTION Bilateral 1998   CHOLECYSTECTOMY  2006   COLON SURGERY  1996   partial colon removal   COLONOSCOPY W/ POLYPECTOMY     DILATION AND CURETTAGE OF UTERUS     Doppler arterial studies  05/13/2008   Both carotid systems demonstrate minimal plaque formation with normal flow velocities throughout.  Vertebrals demonstrate antegrade flow bilaterally.   DOPPLER ECHOCARDIOGRAPHY  2010   Diastolic relaxation abnormality, mild AVSC, mild MR/TR and mild to mod AR   ESOPHAGOGASTRODUODENOSCOPY     HERNIA REPAIR  2005   Ventral   LASIK  1998    Cataract   LUNG BIOPSY     LYMPH NODE DISSECTION     sternum   MASS EXCISION Right 10/19/2016   Procedure: EXCISION DUBUYTRENS NODULE RIGHT MIDDLE FINGER;  Surgeon: Dairl PonderWeingold, Matthew, MD;  Location: [facility];  Service: Orthopedics;  Laterality: Right;   NASAL SEPTUM SURGERY  2006   NOSE SURGERY  2006   Nuclear Test  08/2005   Normal (chest pain)   PARS PLANA VITRECTOMY Left 08/20/2016   PARS PLANA VITRECTOMY Left 08/20/2016   Procedure: PARS PLANA VITRECTOMY WITH 25 GAUGE;  Surgeon: Edmon Crapeankin, Alasdair Kleve A, MD;  [facility];  Service: Ophthalmology;  Laterality: Left;   PARS PLANA VITRECTOMY Right 11/30/2017   Procedure: PARS PLANA VITRECTOMY WITH 25 GAUGE;  Surgeon: Edmon Crapeankin, Jae Bruck A, MD;  [facility];  Service: Ophthalmology;  Laterality: Right;   PARTIAL COLECTOMY  1996   complicated by an abscess formation   PHOTOCOAGULATION WITH LASER Right 11/30/2017   Procedure: PHOTOCOAGULATION WITH LASER;  Surgeon: Edmon Crapeankin, Josephine Wooldridge A, MD;  [facility];  Service: Ophthalmology;  Laterality: Right;   Right hip bursa removal  1991   SBO  2002   TENOLYSIS Right 10/19/2016   Procedure: RIGHT WRIST STENOSING TENOSYNOVITIS RELEASE ;  Surgeon: Dairl PonderWeingold, Matthew, MD;  [facility];  Service: Orthopedics;  Laterality: Right;   TONSILLECTOMY     TRIGGER FINGER RELEASE Right 10/19/2016   Procedure: RIGHT THUMB RELEASE TRIGGER FINGER/A-1 PULLEY;  Surgeon: Dairl PonderWeingold, Matthew, MD;  Location: [facility];  Service: Orthopedics;  Laterality: Right;    FAMILY HISTORY Family History  Problem Relation Age of Onset   Crohn's disease Mother    Heart disease Father    Hyperlipidemia Neg Hx        No history of early CAD   Cancer Neg Hx  No history of lymphoma or leukemia    SOCIAL HISTORY Social History   Tobacco Use   Smoking status: Never   Smokeless tobacco: Never  Vaping Use   Vaping Use: Never used  Substance Use Topics   Alcohol use: No   Drug use: No         OPHTHALMIC EXAM:  Base Eye  Exam     Visual Acuity (ETDRS)       Right Left   Dist [place] 20/25 -2 20/30 -1   Dist ph [place]  NI         Tonometry (Tonopen, 1:37 PM)       Right Left   Pressure 17 15         Pupils       Pupils Dark Light Shape React APD   Right PERRL 4 3 Round Slow None   Left PERRL 4 3 Round Slow None         Visual Fields (Counting fingers)       Left Right    Full Full         Extraocular Movement       Right Left    Full Full         Neuro/Psych     Oriented x3: Yes         Dilation     Both eyes: 1.0% Mydriacyl, 2.5% Phenylephrine @ 1:37 PM           Slit Lamp and Fundus Exam     External Exam       Right Left   External Normal Normal         Slit Lamp Exam       Right Left   Lids/Lashes Normal Normal   Conjunctiva/Sclera White and quiet White and quiet   Cornea Clear Clear   Anterior Chamber Deep and quiet Deep and quiet   Iris Round and reactive Round and reactive   Lens Clear Clear   Anterior Vitreous Normal Normal         Fundus Exam       Right Left   Posterior Vitreous clear, avitric clear, avitric   Disc Normal Normal   C/D Ratio 0.5 0.5   Macula Normal Normal   Vessels Normal Normal   Periphery Normal, good peripheral laser retinopexy nearly 360, no holes Normal, no holes or tears            IMAGING AND PROCEDURES  Imaging and Procedures for 08/12/20  OCT, Retina - OU - Both Eyes       Right Eye Quality was good. Scan locations included subfoveal. Central Foveal Thickness: 292. Progression has been stable. Findings include normal foveal contour.   Left Eye Quality was good. Scan locations included subfoveal. Central Foveal Thickness: 268. Findings include normal foveal contour.   Notes No active maculopathy OU.             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 Retinal hole, right  H33.321     3. Vitreous membranes and strands, left eye  H43.312  1.  OU, clearance of vitreous membranes and strands with excellent acuity.    2.  I suggest follow-up with general ophthalmology or optometry local to her location in [facility]  3.  Ophthalmic Meds Ordered this visit:  No orders of the defined types were placed in this encounter.      Return if symptoms worsen or fail to improve, for Follow-up with her private general ophthalmologist or optometrist.  There are no Patient Instructions on file for this visit.   Explained the diagnoses, plan, and follow up with the patient and they expressed understanding.  Patient expressed understanding of the importance of proper follow up care.   Alford Highland Rosemary Mossbarger M.D. Diseases & Surgery of the Retina and Vitreous [facility] 08/12/20     Abbreviations: M myopia (nearsighted); A astigmatism; H hyperopia (farsighted); P presbyopia; Mrx spectacle prescription;  CTL contact lenses; OD right eye; OS left eye; OU both eyes  XT exotropia; ET esotropia; PEK punctate epithelial keratitis; PEE punctate epithelial erosions; DES dry eye syndrome; MGD meibomian gland dysfunction; ATs artificial tears; PFAT's preservative free artificial tears; NSC nuclear sclerotic cataract; PSC posterior subcapsular cataract; ERM epi-retinal membrane; PVD posterior vitreous detachment; RD retinal detachment; DM diabetes mellitus; DR diabetic retinopathy; NPDR non-proliferative diabetic retinopathy; PDR proliferative diabetic retinopathy; CSME clinically significant macular edema; DME diabetic macular edema; dbh dot blot hemorrhages; CWS cotton wool spot; POAG primary open angle  glaucoma; C/D cup-to-disc ratio; HVF humphrey visual field; GVF goldmann visual field; OCT optical coherence tomography; IOP intraocular pressure; BRVO Branch retinal vein occlusion; CRVO central retinal vein occlusion; CRAO central retinal artery occlusion; BRAO branch retinal artery occlusion; RT retinal tear; SB scleral buckle; PPV pars plana vitrectomy; VH Vitreous hemorrhage; PRP panretinal laser photocoagulation; IVK intravitreal kenalog; VMT vitreomacular traction; MH Macular hole;  NVD neovascularization of the disc; NVE neovascularization elsewhere; AREDS age related eye disease study; ARMD age related macular degeneration; POAG primary open angle glaucoma; EBMD epithelial/anterior basement membrane dystrophy; ACIOL anterior chamber intraocular lens; IOL intraocular lens; PCIOL posterior chamber intraocular lens; Phaco/IOL phacoemulsification with intraocular lens placement; PRK photorefractive keratectomy; LASIK laser assisted in situ keratomileusis; HTN hypertension; DM diabetes mellitus; COPD chronic obstructive pulmonary disease

## 2020-08-12 NOTE — Assessment & Plan Note (Addendum)
Found at time of vitrectomy For vitreous membranes and strands  OD, history of vitrectomy 2019

## 2020-08-12 NOTE — Assessment & Plan Note (Signed)
History of now, cleared post vitrectomy

## 2020-08-12 NOTE — Assessment & Plan Note (Signed)
No recurrence stable

## 2020-08-21 ENCOUNTER — Ambulatory Visit: Payer: Medicare Other | Admitting: Neurology

## 2020-08-21 ENCOUNTER — Telehealth: Payer: Self-pay | Admitting: Neurology

## 2020-08-21 NOTE — Telephone Encounter (Signed)
The patient was seen at [facility] for an audiometric testing procedure.  Test results showed no gaze or spontaneous nystagmus.  Vestibular function.  Normal that included rotational chair, lateral vHIT, scalp vibration, oculomotor assessment, and positional tests.  It is felt that her unsteadiness and imbalance was related to reduced sensation, strength, and stability of the lower extremities, possibly exacerbated by orthostatic hypotension.  There is felt that the patient would benefit from ongoing physical therapy.

## 2020-08-22 ENCOUNTER — Ambulatory Visit (INDEPENDENT_AMBULATORY_CARE_PROVIDER_SITE_OTHER): Payer: Medicare Other | Admitting: Neurology

## 2020-08-22 ENCOUNTER — Encounter: Payer: Self-pay | Admitting: Neurology

## 2020-08-22 VITALS — BP 95/61 | HR 93 | Ht 62.0 in | Wt 218.6 lb

## 2020-08-22 DIAGNOSIS — G4486 Cervicogenic headache: Secondary | ICD-10-CM

## 2020-08-22 DIAGNOSIS — R2681 Unsteadiness on feet: Secondary | ICD-10-CM

## 2020-08-22 MED ORDER — ROPINIROLE HCL 0.5 MG PO TABS: 0.5000 mg | ORAL_TABLET | Freq: Two times a day (BID) | ORAL | 1 refills | Status: AC

## 2020-08-22 NOTE — Progress Notes (Signed)
Reason for visit: Cervicogenic headache, orthostatic hypotension, gait instability  Angela Dudley is an 75 y.o. female  History of present illness:  Angela Dudley is a 75 year old left-handed white female with a history of morbid obesity, gait instability, neck pain with cervicogenic headache, orthostasis, and restless leg syndrome.  The patient continues to have some gait problems.  She also reports some right shoulder discomfort and a trigger finger on her right hand.  She has undergone some physical therapy on her neck and shoulder which has helped her headache some, she has also been placed on Limbitrol which has helped the headaches.  She has not had any recent falls, she uses a cane for ambulation.  She recently had audiometric testing and vestibular function testing, she had no evidence of vestibular disease.  Physical therapy was recommended for her gait instability.  Past Medical History:  Diagnosis Date   Abdominal pain, generalized    Allergy    Anemia    Anxiety    pt denies   Asthma    Back pain    Cervicogenic headache 06/08/2016   Chronic fatigue syndrome    Cramps, muscle, general    Degenerative disc disease    Diverticulosis of colon (without mention of hemorrhage)    Dizziness    Esophageal hernia    Fever    Fibromyalgia    Frequent urination at night    GERD (gastroesophageal reflux disease)    under control   H/O hiatal hernia    Headache(784.0)    Hemorrhoids    Hyperlipidemia    Hypothyroidism    no medication- endrocnologist said she never had it   IBS (irritable bowel syndrome)    Idiopathic peripheral autonomic neuropathy    Left carpal tunnel syndrome    Leg fracture 07/2019   Numbness and tingling    hands, feet, & lips   Obesity    Obesity    Orthostatic hypotension    with prev tilt table test- 08/18/16- "have not passed out in years."   Orthostatic hypotension    Osteoarthritis    Other B-complex deficiencies    Peptic ulcer disease     Peritoneal adhesions (postoperative) (postinfection)    Pre-diabetes    Sarcoidosis    Sarcoidosis    Shortness of breath    exertion- due to chronic fatigue syndrome, sarcoid   Stenosing tenosynovitis    left wrist   Thyroid disease    Unspecified hypothyroidism   Unspecified intestinal obstruction    UTI (lower urinary tract infection)    hx of   Vitreous floaters, left    Wart viral     Past Surgical History:  Procedure Laterality Date   ANTERIOR CERVICAL DECOMP/DISCECTOMY FUSION N/A 10/16/2013   Procedure: Cervical three-four, Cervical five-six Anterior cervical decompression/diskectomy/fusion;  Surgeon: Barnett Abu, MD;  Location: [facility];  Service: Neurosurgery;  Laterality: N/A;  Cervical three-four, Cervical five-six Anterior cervical decompression/diskectomy/fusion   ANTERIOR CERVICAL DECOMP/DISCECTOMY FUSION N/A 07/23/2014   Procedure: Cervical four-five Anterior cervical decompression/diskectomy/fusion;  Surgeon: Barnett Abu, MD;  Location: [facility];  Service: Neurosurgery;  Laterality: N/A;  C4-5 Anterior cervical decompression/diskectomy/fusion   APPENDECTOMY     BACK SURGERY  2015   herniated disc - cervical   BELPHAROPTOSIS REPAIR Bilateral    BIOPSY THYROID     benign   CARPAL TUNNEL RELEASE Bilateral    CATARACT EXTRACTION Bilateral 1998   CHOLECYSTECTOMY  2006   COLON SURGERY  1996  partial colon removal   COLONOSCOPY W/ POLYPECTOMY     DILATION AND CURETTAGE OF UTERUS     Doppler arterial studies  05/13/2008   Both carotid systems demonstrate minimal plaque formation with normal flow velocities throughout.  Vertebrals demonstrate antegrade flow bilaterally.   DOPPLER ECHOCARDIOGRAPHY  2010   Diastolic relaxation abnormality, mild AVSC, mild MR/TR and mild to mod AR   ESOPHAGOGASTRODUODENOSCOPY     HERNIA REPAIR  2005   Ventral   LASIK  1998   Cataract   LUNG BIOPSY     LYMPH NODE DISSECTION     sternum   MASS EXCISION Right 10/19/2016    Procedure: EXCISION DUBUYTRENS NODULE RIGHT MIDDLE FINGER;  Surgeon: Dairl Ponder, MD;  Location: [facility];  Service: Orthopedics;  Laterality: Right;   NASAL SEPTUM SURGERY  2006   NOSE SURGERY  2006   Nuclear Test  08/2005   Normal (chest pain)   PARS PLANA VITRECTOMY Left 08/20/2016   PARS PLANA VITRECTOMY Left 08/20/2016   Procedure: PARS PLANA VITRECTOMY WITH 25 GAUGE;  Surgeon: Edmon Crape, MD;  Location: [facility] OR;  Service: Ophthalmology;  Laterality: Left;   PARS PLANA VITRECTOMY Right 11/30/2017   Procedure: PARS PLANA VITRECTOMY WITH 25 GAUGE;  Surgeon: Edmon Crape, MD;  Location: [facility] OR;  Service: Ophthalmology;  Laterality: Right;   PARTIAL COLECTOMY  1996   complicated by an abscess formation   PHOTOCOAGULATION WITH LASER Right 11/30/2017   Procedure: PHOTOCOAGULATION WITH LASER;  Surgeon: Edmon Crape, MD;  Location: [facility] OR;  Service: Ophthalmology;  Laterality: Right;   Right hip bursa removal  1991   SBO  2002   TENOLYSIS Right 10/19/2016   Procedure: RIGHT WRIST STENOSING TENOSYNOVITIS RELEASE ;  Surgeon: Dairl Ponder, MD;  Location: [facility] OR;  Service: Orthopedics;  Laterality: Right;   TONSILLECTOMY     TRIGGER FINGER RELEASE Right 10/19/2016   Procedure: RIGHT THUMB RELEASE TRIGGER FINGER/A-1 PULLEY;  Surgeon: Dairl Ponder, MD;  Location: [facility] OR;  Service: Orthopedics;  Laterality: Right;    Family History  Problem Relation Age of Onset   Crohn's disease Mother    Heart disease Father    Hyperlipidemia Neg Hx        No history of early CAD   Cancer Neg Hx        No history of lymphoma or leukemia    Social history:  reports that she has never smoked. She has never used smokeless tobacco. She reports that she does not drink alcohol and does not use drugs.    Allergies  Allergen Reactions   Amoxicillin Rash and Other (See Comments)    Did it involve swelling of the face/tongue/throat, SOB, or low BP? No Did it involve sudden or severe rash/hives,  skin peeling, or any reaction on the inside of your mouth or nose? No Did you need to seek medical attention at a hospital or doctor's office? No When did it last happen?      35 years If all above answers are "NO", may proceed with cephalosporin use.     Other Other (See Comments)    Hepatotoxic Gases per MD request this is not to be used due to Chronic Fatigue Syndrome creates risk for Hepatitis.    Decadron [Dexamethasone] Other (See Comments)    Breathing problems. Pt reports that all steroids cause her to retain fluid.   Esomeprazole Magnesium Other (See Comments)     Esophageal spasm   Percocet [Oxycodone-Acetaminophen] Other (  See Comments)    It "wires me" , I can't sleep    Medications:  Prior to Admission medications   Medication Sig Start Date End Date Taking? Authorizing Provider  acetaminophen (TYLENOL) 500 MG tablet Take 1,000 mg by mouth every 6 (six) hours as needed for moderate pain or headache.   Yes [provider]  B Complex-C-Folic Acid (STRESS B COMPLEX PO) Take 1 tablet by mouth daily.   Yes [provider]  Biotin 10 MG CAPS Take 10 mg by mouth daily.    Yes [provider]  Black Elderberry (SAMBUCUS ELDERBERRY PO) Take 1,250 mg by mouth daily.   Yes [provider]  cetirizine (ZYRTEC) 10 MG tablet Take 10 mg by mouth at bedtime.    Yes [provider]  chloridazePOXIDE-amitriptyline (LIMBITROL) 5-12.5 MG tablet Take 0.5-1 tablets by mouth at bedtime. 08/05/20  Yes [provider]  Chromium Picolinate 500 MCG CAPS Take 500 mcg by mouth daily.   Yes [provider]  Coenzyme Q10 (COQ-10 PO) Take 120 mg by mouth daily.   Yes [provider]  ECHINACEA PO Take 1 capsule by mouth daily.   Yes [provider]  gabapentin (NEURONTIN) 100 MG capsule Take 2 capsules (200 mg total) by mouth in the morning, at noon, and at bedtime. 03/18/20  Yes York Spaniel, MD  Ginger, Zingiber  officinalis, (GINGER ROOT) 550 MG CAPS Take 1,100 mg by mouth 3 (three) times daily.    Yes [provider]  Glucosamine-Chondroitin (GLUCOSAMINE CHONDR COMPLEX PO) Take 1 capsule by mouth daily.   Yes [provider]  LECITHIN PO Take 2-4 tablets by mouth See admin instructions. Take 4 tablets at night, may take a 2 to 4 tablets as needed to continue sleep if woken up   Yes [provider]  Magnesium 500 MG TABS Take 1,000 mg by mouth at bedtime.    Yes [provider]  Polyethyl Glycol-Propyl Glycol (SYSTANE) 0.4-0.3 % GEL ophthalmic gel Place 1 application into both eyes 2 (two) times daily as needed (dry eyes).   Yes [provider]  Potassium 99 MG TABS Take 495 mg by mouth at bedtime as needed (muscle cramps).    Yes [provider]  potassium chloride SA (K-DUR,KLOR-CON) 20 MEQ tablet Take 1 tablet (20 mEq total) by mouth daily. Patient taking differently: Take 10 mEq by mouth at bedtime.   Yes Joaquim Nam, MD  rOPINIRole (REQUIP) 0.5 MG tablet Take 1 tablet (0.5 mg total) by mouth in the morning and at bedtime. 02/08/20  Yes York Spaniel, MD  Selenium 200 MCG CAPS Take 200 mcg by mouth daily.   Yes [provider]  spironolactone (ALDACTONE) 50 MG tablet Take 50 mg by mouth daily in the afternoon.   Yes [provider]  torsemide (DEMADEX) 20 MG tablet Take 20 mg by mouth daily.   Yes [provider]  traZODone (DESYREL) 50 MG tablet Take 50 mg by mouth at bedtime.  04/24/19  Yes [provider]  TURMERIC PO Take 800 mg by mouth 3 (three) times daily.   Yes [provider]  Zinc 50 MG TABS Take 50 mg by mouth daily.    Yes [provider]  tiZANidine (ZANAFLEX) 2 MG tablet Take 1 tablet (2 mg total) by mouth 3 (three) times daily. Patient not taking: Reported on 08/22/2020 06/17/20   York Spaniel, MD  venlafaxine XR (EFFEXOR XR) 150 MG 24 hr  capsule Take 1 capsule (150 mg  total) by mouth daily with breakfast. Patient not taking: Reported on 08/22/2020 04/16/20   York SpanielWillis, Rojean Ige K, MD    ROS:  Out of a complete 14 system review of symptoms, the patient complains only of the following symptoms, and all other reviewed systems are negative.  Walking difficulty Neck pain, right shoulder pain Arthritis   Blood pressure 95/61, pulse 93, height 5\' 2"  (1.575 m), weight 218 lb 9.6 oz (99.2 kg).  Physical Exam  General: The patient is alert and cooperative at the time of the examination.  The patient is markedly obese.  Skin: 2+ edema below the knees is seen bilaterally.   Neurologic Exam  Mental status: The patient is alert and oriented x 3 at the time of the examination. The patient has apparent normal recent and remote memory, with an apparently normal attention span and concentration ability.   Cranial nerves: Facial symmetry is present. Speech is normal, no aphasia or dysarthria is noted. Extraocular movements are full. Visual fields are full.  Motor: The patient has good strength in all 4 extremities.  Sensory examination: Soft touch sensation is symmetric on the face, arms, and legs.  Coordination: The patient has good finger-nose-finger and heel-to-shin bilaterally.  Gait and station: The patient has a somewhat wide-based gait, she walks with a cane.  Tandem gait was not attempted.  Romberg is negative.  Reflexes: Deep tendon reflexes are symmetric.   Assessment/Plan:  1.  Morbid obesity  2.  Gait disturbance  3.  Neck pain, cervicogenic headache  4.  Orthostatic hypotension  The patient will be sent for physical therapy for gait training and for leg strengthening exercises.  She will be maintained on the Requip for her restless legs which seems to be helpful.  She is also on gabapentin and Limbitrol for her headaches.  She will follow-up here in 6 months, in the future she can be seen and followed through Dr. Delena Balihima.  Marlan Palau. Keith Kess Mcilwain  MD 08/22/2020 1:32 PM  [facility] [address] [place], [place] [id number]  Phone [phone] Fax [phone]

## 2020-08-26 ENCOUNTER — Telehealth: Payer: Self-pay | Admitting: Neurology

## 2020-08-26 NOTE — Telephone Encounter (Signed)
Pt called, referral for physical therapy sent to wrong rehab facility. Need referral sent to [facility] in [place], [place].

## 2020-08-27 NOTE — Telephone Encounter (Signed)
Referral sent to [facility] [place],  [place]. P: [phone]

## 2020-09-01 ENCOUNTER — Other Ambulatory Visit: Payer: Self-pay | Admitting: Neurology

## 2021-06-23 ENCOUNTER — Ambulatory Visit (INDEPENDENT_AMBULATORY_CARE_PROVIDER_SITE_OTHER): Payer: Medicare Other | Admitting: Psychiatry

## 2021-06-23 ENCOUNTER — Encounter: Payer: Self-pay | Admitting: Psychiatry

## 2021-06-23 VITALS — BP 124/73 | HR 85 | Ht 61.0 in | Wt 213.0 lb

## 2021-06-23 DIAGNOSIS — R1310 Dysphagia, unspecified: Secondary | ICD-10-CM

## 2021-06-23 DIAGNOSIS — R29898 Other symptoms and signs involving the musculoskeletal system: Secondary | ICD-10-CM

## 2021-06-23 DIAGNOSIS — H532 Diplopia: Secondary | ICD-10-CM | POA: Diagnosis not present

## 2021-06-23 NOTE — Progress Notes (Signed)
[facility]  PATIENT: Angela Dudley DOB: 05-27-1945  REFERRING CLINICIAN: Oletha Blend, MD HISTORY FROM: self REASON FOR VISIT: imbalance, double vision, dysphagia, arm and leg weakness   HISTORICAL  CHIEF COMPLAINT:  Chief Complaint  Patient presents with   Headache    RM 1 alone  Pt is well, here for headaches, imbalance and hypotension     HISTORY OF PRESENT ILLNESS:  The patient presents for evaluation of multiple symptoms. In the past 2 weeks she has developed fatigue, weakness, and numbness. She was working in the yard a few weeks ago and when was finished she became extremely tired and and tremulous. States she could not do anything for 3 days after that because she kept shaking when she tried to do anything.   She has developed intermittent numbness in her left hand. Her left arm feels weak and will lose tone and drop if she lifts it above her head. She has worsening headaches which start at the base of her neck. These are worsened whenever she uses her arms. Left side of her face has been intermittently numb as well.  She has also developed weakness and numbness of legs. Her left leg feels weak like it will give out from under her. Both legs will intermittently become numb though the left one is numb more frequently. She has cramping pains down both of her legs. She does have baseline numbness in both of her feet from neuropathy.  Feels her vision is coming in and out of focus. She can see well with each individual eye, but cannot focus when both eyes are open at the same time. Sees a line in the middle of her field of vision. Saw her retinal specialist who said her eye exam is normal.   Voice has become more hoarse. She does have a history of multiple intubations and esophageal stricture.  The patient previously followed with Dr. Anne Hahn for headaches, RLS, and gait instability. She has undergone vestibular testing without evidence of vestibular  disease. Imbalance was thought to be secondary to reduced sensation and strength of her lower extremities. She was referred to PT for gait instability. This significantly helped her balance.   OTHER MEDICAL CONDITIONS: degenerative disc disease s/p cervical fusion, esophageal stricture, B12 deficiency, RLS   REVIEW OF SYSTEMS: Full 14 system review of systems performed and negative with exception of: numbness, weakness, dysphagia, vision changes  ALLERGIES: Allergies  Allergen Reactions   Amoxicillin Rash and Other (See Comments)    Did it involve swelling of the face/tongue/throat, SOB, or low BP? No Did it involve sudden or severe rash/hives, skin peeling, or any reaction on the inside of your mouth or nose? No Did you need to seek medical attention at a hospital or doctor's office? No When did it last happen?      35 years If all above answers are "NO", may proceed with cephalosporin use.     Other Other (See Comments)    Hepatotoxic Gases per MD request this is not to be used due to Chronic Fatigue Syndrome creates risk for Hepatitis.    Decadron [Dexamethasone] Other (See Comments)    Breathing problems. Pt reports that all steroids cause her to retain fluid.   Esomeprazole Magnesium Other (See Comments)     Esophageal spasm   Percocet [Oxycodone-Acetaminophen] Other (See Comments)    It "wires me" , I can't sleep    HOME MEDICATIONS: Outpatient Medications Prior to Visit  Medication Sig Dispense  Refill   acetaminophen (TYLENOL) 500 MG tablet Take 1,000 mg by mouth every 6 (six) hours as needed for moderate pain or headache.     B Complex-C-Folic Acid (STRESS B COMPLEX PO) Take 1 tablet by mouth daily.     Biotin 10 MG CAPS Take 10 mg by mouth daily.      Black Elderberry (SAMBUCUS ELDERBERRY PO) Take 1,250 mg by mouth daily.     cetirizine (ZYRTEC) 10 MG tablet Take 10 mg by mouth at bedtime.      chloridazePOXIDE-amitriptyline (LIMBITROL) 5-12.5 MG tablet TAKE 1/2 to 1  Tablet at bedtime for chronic tension headache 30 tablet 1   Chromium Picolinate 500 MCG CAPS Take 500 mcg by mouth daily.     Coenzyme Q10 (COQ-10 PO) Take 120 mg by mouth daily.     ECHINACEA PO Take 1 capsule by mouth daily.     gabapentin (NEURONTIN) 100 MG capsule Take 2 capsules by mouth 2 (two) times daily. 120 capsule 6   Ginger, Zingiber officinalis, (GINGER ROOT) 550 MG CAPS Take 1,100 mg by mouth 3 (three) times daily.      Glucosamine-Chondroitin (GLUCOSAMINE CHONDR COMPLEX PO) Take 1 capsule by mouth daily.     LECITHIN PO Take 2-4 tablets by mouth See admin instructions. Take 4 tablets at night, may take a 2 to 4 tablets as needed to continue sleep if woken up     Magnesium 500 MG TABS Take 1,000 mg by mouth at bedtime.      Polyethyl Glycol-Propyl Glycol (SYSTANE) 0.4-0.3 % GEL ophthalmic gel Place 1 application into both eyes 2 (two) times daily as needed (dry eyes).     Potassium 99 MG TABS Take 495 mg by mouth at bedtime as needed (muscle cramps).      potassium chloride SA (K-DUR,KLOR-CON) 20 MEQ tablet Take 1 tablet (20 mEq total) by mouth daily. (Patient taking differently: Take 10 mEq by mouth at bedtime.) 90 tablet 1   rOPINIRole (REQUIP) 0.5 MG tablet Take 1 tablet (0.5 mg total) by mouth in the morning and at bedtime. 180 tablet 1   Selenium 200 MCG CAPS Take 200 mcg by mouth daily.     spironolactone (ALDACTONE) 50 MG tablet Take 50 mg by mouth daily in the afternoon.     traZODone (DESYREL) 50 MG tablet Take 50 mg by mouth at bedtime.      TURMERIC PO Take 800 mg by mouth 3 (three) times daily.     Zinc 50 MG TABS Take 50 mg by mouth daily.      torsemide (DEMADEX) 20 MG tablet Take 20 mg by mouth daily. (Patient not taking: Reported on 06/23/2021)     No facility-administered medications prior to visit.    PAST MEDICAL HISTORY: Past Medical History:  Diagnosis Date   Abdominal pain, generalized    Allergy    Anemia    Anxiety    pt denies   Asthma    Back  pain    Cervicogenic headache 06/08/2016   Chronic fatigue syndrome    Cramps, muscle, general    Degenerative disc disease    Diverticulosis of colon (without mention of hemorrhage)    Dizziness    Esophageal hernia    Fever    Fibromyalgia    Frequent urination at night    GERD (gastroesophageal reflux disease)    under control   H/O hiatal hernia    Headache(784.0)    Hemorrhoids    Hyperlipidemia  Hypothyroidism    no medication- endrocnologist said she never had it   IBS (irritable bowel syndrome)    Idiopathic peripheral autonomic neuropathy    Left carpal tunnel syndrome    Leg fracture 07/2019   Numbness and tingling    hands, feet, & lips   Obesity    Obesity    Orthostatic hypotension    with prev tilt table test- 08/18/16- "have not passed out in years."   Orthostatic hypotension    Osteoarthritis    Other B-complex deficiencies    Peptic ulcer disease    Peritoneal adhesions (postoperative) (postinfection)    Pre-diabetes    Sarcoidosis    Sarcoidosis    Shortness of breath    exertion- due to chronic fatigue syndrome, sarcoid   Stenosing tenosynovitis    left wrist   Thyroid disease    Unspecified hypothyroidism   Unspecified intestinal obstruction    UTI (lower urinary tract infection)    hx of   Vitreous floaters, left    Wart viral     PAST SURGICAL HISTORY: Past Surgical History:  Procedure Laterality Date   ANTERIOR CERVICAL DECOMP/DISCECTOMY FUSION N/A 10/16/2013   Procedure: Cervical three-four, Cervical five-six Anterior cervical decompression/diskectomy/fusion;  Surgeon: Barnett Abu, MD;  Location: [facility];  Service: Neurosurgery;  Laterality: N/A;  Cervical three-four, Cervical five-six Anterior cervical decompression/diskectomy/fusion   ANTERIOR CERVICAL DECOMP/DISCECTOMY FUSION N/A 07/23/2014   Procedure: Cervical four-five Anterior cervical decompression/diskectomy/fusion;  Surgeon: Barnett Abu, MD;  Location: [facility];   Service: Neurosurgery;  Laterality: N/A;  C4-5 Anterior cervical decompression/diskectomy/fusion   APPENDECTOMY     BACK SURGERY  2015   herniated disc - cervical   BELPHAROPTOSIS REPAIR Bilateral    BIOPSY THYROID     benign   CARPAL TUNNEL RELEASE Bilateral    CATARACT EXTRACTION Bilateral 1998   CHOLECYSTECTOMY  2006   COLON SURGERY  1996   partial colon removal   COLONOSCOPY W/ POLYPECTOMY     DILATION AND CURETTAGE OF UTERUS     Doppler arterial studies  05/13/2008   Both carotid systems demonstrate minimal plaque formation with normal flow velocities throughout.  Vertebrals demonstrate antegrade flow bilaterally.   DOPPLER ECHOCARDIOGRAPHY  2010   Diastolic relaxation abnormality, mild AVSC, mild MR/TR and mild to mod AR   ESOPHAGOGASTRODUODENOSCOPY     HERNIA REPAIR  2005   Ventral   LASIK  1998   Cataract   LUNG BIOPSY     LYMPH NODE DISSECTION     sternum   MASS EXCISION Right 10/19/2016   Procedure: EXCISION DUBUYTRENS NODULE RIGHT MIDDLE FINGER;  Surgeon: Dairl Ponder, MD;  Location: [facility];  Service: Orthopedics;  Laterality: Right;   NASAL SEPTUM SURGERY  2006   NOSE SURGERY  2006   Nuclear Test  08/2005   Normal (chest pain)   PARS PLANA VITRECTOMY Left 08/20/2016   PARS PLANA VITRECTOMY Left 08/20/2016   Procedure: PARS PLANA VITRECTOMY WITH 25 GAUGE;  Surgeon: Edmon Crape, MD;  Location: [facility] OR;  Service: Ophthalmology;  Laterality: Left;   PARS PLANA VITRECTOMY Right 11/30/2017   Procedure: PARS PLANA VITRECTOMY WITH 25 GAUGE;  Surgeon: Edmon Crape, MD;  Location: [facility] OR;  Service: Ophthalmology;  Laterality: Right;   PARTIAL COLECTOMY  1996   complicated by an abscess formation   PHOTOCOAGULATION WITH LASER Right 11/30/2017   Procedure: PHOTOCOAGULATION WITH LASER;  Surgeon: Edmon Crape, MD;  Location: [facility] OR;  Service: Ophthalmology;  Laterality:  Right;   Right hip bursa removal  1991   SBO  2002   TENOLYSIS Right 10/19/2016   Procedure: RIGHT  WRIST STENOSING TENOSYNOVITIS RELEASE ;  Surgeon: Dairl Ponder, MD;  Location: [facility] OR;  Service: Orthopedics;  Laterality: Right;   TONSILLECTOMY     TRIGGER FINGER RELEASE Right 10/19/2016   Procedure: RIGHT THUMB RELEASE TRIGGER FINGER/A-1 PULLEY;  Surgeon: Dairl Ponder, MD;  Location: [facility] OR;  Service: Orthopedics;  Laterality: Right;    FAMILY HISTORY: Family History  Problem Relation Age of Onset   Crohn's disease Mother    Heart disease Father    Hyperlipidemia Neg Hx        No history of early CAD   Cancer Neg Hx        No history of lymphoma or leukemia    SOCIAL HISTORY: Social History   Socioeconomic History   Marital status: Widowed    Spouse name: Not on file   Number of children: 0   Years of education: BS   Highest education level: Not on file  Occupational History   Occupation: Former, Charity fundraiser, Retired  Tobacco Use   Smoking status: Never   Smokeless tobacco: Never  Vaping Use   Vaping Use: Never used  Substance and Sexual Activity   Alcohol use: No   Drug use: No   Sexual activity: Not on file  Other Topics Concern   Not on file  Social History Narrative   Lives alone   Left Handed   Caffeine use: One soft drink daily   Social Determinants of Health   Financial Resource Strain: Not on file  Food Insecurity: Not on file  Transportation Needs: Not on file  Physical Activity: Not on file  Stress: Not on file  Social Connections: Not on file  Intimate Partner Violence: Not on file     PHYSICAL EXAM   GENERAL EXAM/CONSTITUTIONAL: Vitals:  Vitals:   06/23/21 1346  BP: 124/73  Pulse: 85  Weight: 213 lb (96.6 kg)  Height: 5\' 1"  (1.549 m)   Body mass index is 40.25 kg/m. Wt Readings from Last 3 Encounters:  06/23/21 213 lb (96.6 kg)  08/22/20 218 lb 9.6 oz (99.2 kg)  02/08/20 222 lb (100.7 kg)   NEUROLOGIC: MENTAL STATUS:  awake, alert, oriented to person, place and time recent and remote memory intact normal attention and  concentration  CRANIAL NERVE:  2nd, 3rd, 4th, 6th - pupils equal and reactive to light, visual fields full to confrontation, extraocular muscles intact, no nystagmus 5th - facial sensation symmetric 7th - facial strength symmetric 8th - hearing intact 9th - palate elevates symmetrically, uvula midline 11th - shoulder shrug symmetric 12th - tongue protrusion midline  MOTOR:  4+/5 arm abduction on left, decreased grip strength on left, 4+/5 bilateral hip flexion  SENSORY:  Decreased sensation to pinprick over left hand  COORDINATION:  finger-nose-finger, fine finger movements normal  REFLEXES:  deep tendon reflexes present and symmetric  GAIT/STATION:  Walks with cane, wise based gait     DIAGNOSTIC DATA (LABS, IMAGING, TESTING) - I reviewed patient records, labs, notes, testing and imaging myself where available.  Lab Results  Component Value Date   WBC 6.8 07/12/2019   HGB 15.6 (H) 07/12/2019   HCT 47.5 (H) 07/12/2019   MCV 91.2 07/12/2019   PLT 257 07/12/2019      Component Value Date/Time   NA 133 (L) 07/12/2019 0021   K 4.4 07/12/2019 0021   CL 101 07/12/2019  0021   CO2 22 07/12/2019 0021   GLUCOSE 118 (H) 07/12/2019 0021   BUN 13 07/12/2019 0021   CREATININE 0.67 07/12/2019 0021   CALCIUM 9.3 07/12/2019 0021   PROT 7.0 10/29/2011 1256   ALBUMIN 3.7 10/29/2011 1256   AST 26 10/29/2011 1256   ALT 25 10/29/2011 1256   ALKPHOS 64 10/29/2011 1256   BILITOT 0.7 10/29/2011 1256   GFRNONAA >60 07/12/2019 0021   GFRAA >60 07/12/2019 0021   Lab Results  Component Value Date   CHOL 200 10/29/2011   HDL 30.40 (L) 10/29/2011   LDLDIRECT 153.5 10/29/2011   TRIG 202.0 (H) 10/29/2011   CHOLHDL 7 10/29/2011   No results found for: "HGBA1C" Lab Results  Component Value Date   VITAMINB12 1,029 (H) 10/29/2011   Lab Results  Component Value Date   TSH 1.15 10/29/2011    Brain MRI 11/03/17: mild scattered small vessel ischemic changes  MRI C-spine  11/20/2017: 1.   The spinal cord appears normal. 2.   Remote C3-C6 fusion. 3.   Disc protrusion at C2-C3 that does not cause spinal stenosis or nerve root compression 4.   Disc bulging C6-C7 that does not cause spinal stenosis or nerve root compression. 5.   There are no changes compared to the 05/03/2016 MRI.  MRI L-spine 2003: 1.  FACET DEGENERATIVE DISEASE AT L4-5, ALLOWING 1 MM ANTEROLISTHESIS.  FACET AND LIGAMENTOUS  HYPERTROPHY.  SHALLOW DISC PROTRUSION WITH SLIGHT CAUDAL DOWNTURNING.  THESE FACTORS COMBINE TO  RESULT IN MILD STENOSIS OF THE CANAL AND LATERAL RECESSES, NOT DEFINITELY COMPRESSIVE.  2.  SHALLOW PROTRUSION WITH A FOCAL LEFT POSTEROLATERAL HERNIATION AT L5-S1, WITH LEFT LATERAL  RECESS STENOSIS, WHICH COULD AFFECT THE LEFT S-1 NERVE ROOT.   ASSESSMENT AND PLAN  76 y.o. year old female with a history of degenerative disc disease s/p cervical fusion, esophageal stricture, B12 deficiency, RLS who presents for evaluation of numbness, weakness, vision changes, and worsening dysphagia over the past 2 weeks. Exam with weakness and decreased sensation of left upper extremity and bilateral lower extremities. Will order MRI brain to assess for potential stroke or other structural cause of worsening dysphagia and vision changes. MRI C-spine and L-spine ordered to assess for worsening of her known degenerative disc disease.   1. Arm weakness   2. Weakness of left lower extremity   3. Diplopia   4. Dysphagia, unspecified type       PLAN: -MRI brain, C-spine, L-spine  Orders Placed This Encounter  Procedures   MR BRAIN W WO CONTRAST   MR CERVICAL SPINE WO CONTRAST   MR LUMBAR SPINE WO CONTRAST    No orders of the defined types were placed in this encounter.   Return in about 6 months (around 12/23/2021).    Ocie Doyne, MD 06/23/21 2:50 PM  I spent an average of 60 minutes chart reviewing and counseling the patient, with at least 50% of the time face to face  with the patient.   [facility] [address], [address] [place], [place] [address] [phone]

## 2021-06-23 NOTE — Patient Instructions (Signed)
MRI brain, MRI of the neck, and MRI of the lower back

## 2021-06-24 ENCOUNTER — Telehealth: Payer: Self-pay | Admitting: Psychiatry

## 2021-06-24 NOTE — Telephone Encounter (Signed)
I sent the patient to [facility] because the order said open MRI. She called me and said she wanted to get it done at [facility] I explained that they do not have an open MRI, [facility] does that's why I sent her there. She said that [facility] is too far and asked that I send her to [facility]. I told her I do not know If they have an open MRI. I faxed the orders.

## 2021-06-24 NOTE — Telephone Encounter (Signed)
[facility] NPR case #[id number] sent to [facility]

## 2021-07-01 ENCOUNTER — Encounter: Payer: Self-pay | Admitting: *Deleted

## 2021-07-01 ENCOUNTER — Other Ambulatory Visit: Payer: Self-pay | Admitting: Psychiatry

## 2021-07-01 ENCOUNTER — Telehealth: Payer: Self-pay | Admitting: *Deleted

## 2021-07-01 DIAGNOSIS — M545 Low back pain, unspecified: Secondary | ICD-10-CM

## 2021-07-01 NOTE — Telephone Encounter (Signed)
Received MRI lumbar spine report form [facility]. Placed on Dr Quentin Mulling desk for review.

## 2021-07-02 ENCOUNTER — Telehealth: Payer: Self-pay | Admitting: Psychiatry

## 2021-07-02 NOTE — Telephone Encounter (Signed)
Referral for Physical Therapy sent to [facility] [phone].

## 2021-07-16 ENCOUNTER — Ambulatory Visit: Payer: Medicare Other | Admitting: Psychiatry

## 2021-12-31 ENCOUNTER — Ambulatory Visit: Payer: Medicare Other | Admitting: Psychiatry

## 2022-01-07 ENCOUNTER — Encounter: Payer: Self-pay | Admitting: Psychiatry

## 2022-01-07 ENCOUNTER — Ambulatory Visit (INDEPENDENT_AMBULATORY_CARE_PROVIDER_SITE_OTHER): Payer: Medicare Other | Admitting: Psychiatry

## 2022-01-07 VITALS — BP 114/64 | HR 91 | Ht 61.0 in | Wt 218.0 lb

## 2022-01-07 DIAGNOSIS — M5416 Radiculopathy, lumbar region: Secondary | ICD-10-CM

## 2022-01-07 DIAGNOSIS — R2689 Other abnormalities of gait and mobility: Secondary | ICD-10-CM

## 2022-01-07 NOTE — Patient Instructions (Addendum)
Preventing Falls at Home  Falls are common, often dreaded events in the lives of older people. Aside from the obvious injuries and even death that may result, fall can cause wide-ranging consequences including loss of independence, mental decline, decreased activity and mobility. Younger people are also at risk of falling, especially those with chronic illnesses and fatigue.  Ways to reduce risk for falling Examine diet and medications. Warm foods and alcohol dilate blood vessels, which can lead to dizziness when standing. Sleep aids, antidepressants and pain medications can also increase the likelihood of a fall.  Get a vision exam. Poor vision, cataracts and glaucoma increase the chances of falling.  Check foot gear. Shoes should fit snugly and have a sturdy, nonskid sole and a broad, low heel  Participate in a physician-approved exercise program to build and maintain muscle strength and improve balance and coordination. Programs that use ankle weights or stretch bands are excellent for muscle-strengthening. Water aerobics programs and low-impact Tai Chi programs have also been shown to improve balance and coordination.  Increase vitamin D intake. Vitamin D improves muscle strength and increases the amount of calcium the body is able to absorb and deposit in bones.  How to prevent falls from common hazards Floors - Remove all loose wires, cords, and throw rugs. Minimize clutter. Make sure rugs are anchored and smooth. Keep furniture in its usual place.  Chairs -- Use chairs with straight backs, armrests and firm seats. Add firm cushions to existing pieces to add height.  Bathroom - Install grab bars and non-skid tape in the tub or shower. Use a bathtub transfer bench or a shower chair with a back support Use an elevated toilet seat and/or safety rails to assist standing from a low surface. Do not use towel racks or bathroom tissue holders to help you stand.  Lighting - Make sure halls,  stairways, and entrances are well-lit. Install a night light in your bathroom or hallway. Make sure there is a light switch at the top and bottom of the staircase. Turn lights on if you get up in the middle of the night. Make sure lamps or light switches are within reach of the bed if you have to get up during the night.  Kitchen - Install non-skid rubber mats near the sink and stove. Clean spills immediately. Store frequently used utensils, pots, pans between waist and eye level. This helps prevent reaching and bending. Sit when getting things out of lower cupboards.  Living room/ Bedrooms - Place furniture with wide spaces in between, giving enough room to move around. Establish a route through the living room that gives you something to hold onto as you walk.  Stairs - Make sure treads, rails, and rugs are secure. Install a rail on both sides of the stairs. If stairs are a threat, it might be helpful to arrange most of your activities on the lower level to reduce the number of times you must climb the stairs.  Entrances and doorways - Install metal handles on the walls adjacent to the doorknobs of all doors to make it more secure as you travel through the doorway.  Tips for maintaining balance Keep at least one hand free at all times. Try using a backpack or fanny pack to hold things rather than carrying them in your hands. Never carry objects in both hands when walking as this interferes with keeping your balance.  Attempt to swing both arms from front to back while walking. This might require a conscious   your balance.   Attempt to swing both arms from front to back while walking.   Consciously lift your feet off of the ground when walking. Shuffling and dragging of the feet is a common culprit in losing your balance.   When trying to navigate turns, use a "U" technique of facing forward and making a wide turn, rather than pivoting sharply.   Try to stand with your feet shoulder-length apart. When your feet are close together for any length of time, you increase your risk of losing your balance  and falling.   Do one thing at a time. Don't try to walk and accomplish another task, such as reading or looking around. The decrease in your automatic reflexes complicates motor function, so the less distraction, the better.   Do not wear rubber or gripping soled shoes, they might "catch" on the floor and cause tripping.   Move slowly when changing positions. Use deliberate, concentrated movements and, if needed, use a grab bar or walking aid. Count 15 seconds between each movement. For example, when rising from a seated position, wait 15 seconds after standing to begin walking.   If balance is a continuous problem, you might want to consider a walking aid such as a cane, walking stick, or walker. Once you've mastered walking with help, you might be ready to try it on your own again.     MIND Diet: The [place] Diet Intervention for Neurodegenerative Delay, or MIND diet, targets the health of the aging brain. Research participants with the highest MIND diet scores had a significantly slower rate of cognitive decline compared with those with the lowest scores. The effects of the MIND diet on cognition showed greater effects than either the Mediterranean or the DASH diet alone.  The healthy items the MIND diet guidelines suggest include:  3+ servings a day of whole grains 1+ servings a day of vegetables (other than green leafy) 6+ servings a week of green leafy vegetables 5+ servings a week of nuts 4+ meals a week of beans 2+ servings a week of berries 2+ meals a week of poultry 1+ meals a week of fish Mainly olive oil if added fat is used  The unhealthy items, which are higher in saturated and trans fat, include: Less than 5 servings a week of pastries and sweets Less than 4 servings a week of red meat (including beef, pork, lamb, and products made from these meats) Less than one serving a week of cheese and fried foods Less than 1 tablespoon a day of butter/stick margarine

## 2022-01-07 NOTE — Progress Notes (Signed)
   CC:  imbalance, weakness  Follow-up Visit  Last visit: 06/23/21  Brief HPI: 77 year old female with a history of degenerative disc disease s/p cervical fusion, esophageal stricture, B12 deficiency, RLS who follows in clinic for imbalance and weakness.  At her last visit, MRI brain, C-spine, and L-spine were ordered.  Interval History: 06/30/21 MRI brain was normal. MRI C-spine showed stable degenerative changes with mild left foraminal stenosis at C5-6. MRI L-spine showed multilevel degenerative changes with canal stenosis at L3-4 and mild foraminal stenosis at L1-2, L2-3, L3-4, and L5-1.  She underwent PT for balance and had her last session in October. She is now seeing PM&R for her lumbar radiculopathy and has undergone lumbar injections. Feels her balance and pain have improved significantly. She is now able to walk short distances without her cane.  Physical Exam:   Vital Signs: BP 114/64   Pulse 91   Ht 5\' 1"  (1.549 m)   Wt 218 lb (98.9 kg)   BMI 41.19 kg/m  GENERAL:  well appearing, in no acute distress, alert  SKIN:  Color, texture, turgor normal. No rashes or lesions HEAD:  Normocephalic/atraumatic. RESP: normal respiratory effort MSK:  No gross joint deformities.   NEUROLOGICAL: Mental Status: Alert, oriented to person, place and time, Follows commands, and Speech fluent and appropriate. Cranial Nerves: PERRL, face symmetric, no dysarthria, hearing grossly intact Motor: 5/5 hip flexion bilaterally Gait: wide based gait, now able to walk short distances without cane  IMPRESSION: 77 y.o. year old female with a history of degenerative disc disease s/p cervical fusion, esophageal stricture, B12 deficiency, RLS who presents for follow up of imbalance and weakness. MRI brain and C-spine were stable. MRI lumbar spine showed multilevel degenerative changes, and she has had significant improvement in her balance with PT and lumbar injections through PM&R. She will continue to  follow with PM&R for her lumbar radiculopathy. Will have her follow up in clinic as needed if new concerns arise.  PLAN: -Continue to follow with PM&R for lumbar injections -Return to clinic if new concerns develop   Follow-up: PRN  I spent a total of 42 minutes on the date of the service. Discussed medication side effects, adverse reactions and drug interactions. Written educational materials and patient instructions outlining all of the above were given.  Genia Harold, MD 01/08/21 1:17 PM

## 2022-05-08 ENCOUNTER — Ambulatory Visit: Admit: 2022-05-08 | Payer: 59 | Admitting: Orthopedic Surgery

## 2022-05-08 SURGERY — CARPAL TUNNEL RELEASE
Anesthesia: Monitor Anesthesia Care | Laterality: Left

## 2022-09-02 ENCOUNTER — Other Ambulatory Visit: Payer: Self-pay
